# Patient Record
Sex: Female | Born: 1985 | Race: White | Hispanic: No | Marital: Married | Smoking: Current every day smoker
Health system: Southern US, Community
[De-identification: ages and names within clinical notes are randomized; demographics above are authoritative.]

## PROBLEM LIST (undated history)

## (undated) DIAGNOSIS — J45909 Unspecified asthma, uncomplicated: Secondary | ICD-10-CM

## (undated) DIAGNOSIS — F419 Anxiety disorder, unspecified: Secondary | ICD-10-CM

## (undated) HISTORY — PX: LEEP: SHX91

---
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
## 2007-04-15 ENCOUNTER — Emergency Department: Payer: Self-pay

## 2009-03-22 ENCOUNTER — Observation Stay: Payer: Self-pay

## 2009-05-14 ENCOUNTER — Observation Stay: Payer: Self-pay

## 2009-05-15 ENCOUNTER — Inpatient Hospital Stay: Payer: Self-pay | Admitting: Obstetrics & Gynecology

## 2010-01-14 ENCOUNTER — Ambulatory Visit: Payer: Self-pay | Admitting: Family Medicine

## 2012-06-01 ENCOUNTER — Emergency Department: Payer: Self-pay | Admitting: Emergency Medicine

## 2013-06-06 ENCOUNTER — Emergency Department: Payer: Self-pay | Admitting: Emergency Medicine

## 2014-02-02 ENCOUNTER — Emergency Department: Payer: Self-pay | Admitting: Emergency Medicine

## 2014-02-02 ENCOUNTER — Ambulatory Visit: Payer: Self-pay | Admitting: Family Medicine

## 2014-02-02 LAB — BASIC METABOLIC PANEL
Anion Gap: 8 (ref 7–16)
BUN: 12 mg/dL (ref 7–18)
CHLORIDE: 108 mmol/L — AB (ref 98–107)
Calcium, Total: 8.1 mg/dL — ABNORMAL LOW (ref 8.5–10.1)
Co2: 24 mmol/L (ref 21–32)
Creatinine: 0.68 mg/dL (ref 0.60–1.30)
EGFR (African American): >60
EGFR (Non-African Amer.): >60
GLUCOSE: 89 mg/dL (ref 65–99)
Osmolality: 279 (ref 275–301)
POTASSIUM: 3.3 mmol/L — AB (ref 3.5–5.1)
SODIUM: 140 mmol/L (ref 136–145)

## 2014-02-02 LAB — CBC
HCT: 39.9 % (ref 35.0–47.0)
HGB: 13 g/dL (ref 12.0–16.0)
MCH: 30.4 pg (ref 26.0–34.0)
MCHC: 32.6 g/dL (ref 32.0–36.0)
MCV: 93 fL (ref 80–100)
PLATELETS: 201 10*3/uL (ref 150–440)
RBC: 4.27 10*6/uL (ref 3.80–5.20)
RDW: 13.5 % (ref 11.5–14.5)
WBC: 10.7 10*3/uL (ref 3.6–11.0)

## 2014-02-02 LAB — LIPASE, BLOOD: Lipase: 120 U/L (ref 73–393)

## 2014-04-19 ENCOUNTER — Emergency Department: Payer: Self-pay | Admitting: Emergency Medicine

## 2014-05-21 ENCOUNTER — Ambulatory Visit: Payer: Self-pay | Admitting: Physician Assistant

## 2014-05-21 LAB — RAPID STREP-A WITH REFLX: Micro Text Report: NEGATIVE

## 2014-05-23 LAB — BETA STREP CULTURE(ARMC)

## 2014-07-04 ENCOUNTER — Emergency Department: Payer: Self-pay | Admitting: Emergency Medicine

## 2015-03-17 ENCOUNTER — Emergency Department
Admission: EM | Admit: 2015-03-17 | Discharge: 2015-03-17 | Disposition: A | Discharge status: Home / Self Care | Payer: Self-pay | Attending: Emergency Medicine | Admitting: Emergency Medicine

## 2015-03-17 DIAGNOSIS — J069 Acute upper respiratory infection, unspecified: Secondary | ICD-10-CM | POA: Insufficient documentation

## 2015-03-17 LAB — POCT RAPID STREP A: Streptococcus, Group A Screen (Direct): NEGATIVE

## 2015-03-17 MED ORDER — BENZONATATE 100 MG PO CAPS: 100.0000 mg | ORAL_CAPSULE | Freq: Four times a day (QID) | ORAL | Status: AC | PRN

## 2015-03-17 NOTE — ED Notes (Signed)
Pt c/o sore throat with cough congestion since Tuesday..  Denies fever..Marland Kitchen

## 2015-03-17 NOTE — ED Notes (Signed)
Pt reports she has been coughing up a lot and gagged and vomited sputum.

## 2015-03-17 NOTE — ED Provider Notes (Signed)
[facility] Emergency Department Provider Note  ____________________________________________  Time seen: On arrival  I have reviewed the triage vital signs and the nursing notes.   HISTORY  Chief Complaint Cough    HPI Shelia Butler is a 29 y.o. female who presents with complaints of cough, congestion and sore throat for approximately 6 days. She presents with her daughter who is also a patient with similar symptoms. She denies shortness of breath. She does report productive cough with yellow phlegm. No recent travel. She complains of mild sore throat and has tried ibuprofen and Tylenol with little relief.    No past medical history on file.  There are no active problems to display for this patient.   No past surgical history on file.  Current Outpatient Rx  Name  Route  Sig  Dispense  Refill  . benzonatate (TESSALON PERLES) 100 MG capsule   Oral   Take 1 capsule (100 mg total) by mouth every 6 (six) hours as needed for cough.   30 capsule   0     Allergies Review of patient's allergies indicates no known allergies.  No family history on file.  Social History Social History  Substance Use Topics  . Smoking status: Not on file  . Smokeless tobacco: Not on file  . Alcohol Use: Not on file    Review of Systems  Constitutional: Negative for fever. Eyes: Negative for visual changes. ENT: Negative for sore throat   Genitourinary: Negative for dysuria. Musculoskeletal: Negative for back pain. Skin: Negative for rash. Neurological: Negative for headaches or focal weakness   ____________________________________________   PHYSICAL EXAM:  VITAL SIGNS: ED Triage Vitals  Enc Vitals Group     BP 03/17/15 0854 128/51 mmHg     Pulse Rate 03/17/15 0854 80     Resp 03/17/15 0854 18     Temp 03/17/15 0854 98.4 F (36.9 C)     Temp Source 03/17/15 0854 Oral     SpO2 03/17/15 0854 99 %     Weight 03/17/15 0854 185 lb (83.915 kg)   Height 03/17/15 0854 5\' 5"  (1.651 m)     Head Cir --      Peak Flow --      Pain Score 03/17/15 0854 10     Pain Loc --      Pain Edu? --      Excl. in GC? --      Constitutional: Alert and oriented. Well appearing and in no distress. Eyes: Conjunctivae are normal.  ENT   Head: Normocephalic and atraumatic.   Mouth/Throat: Mucous membranes are moist. Pharynx is normal, no erythema or swelling or discharge Cardiovascular: Normal rate, regular rhythm.  Respiratory: Normal respiratory effort without tachypnea nor retractions. No wheezing or rales Musculoskeletal: Nontender with normal range of motion in all extremities. Neurologic:  Normal speech and language. No gross focal neurologic deficits are appreciated. Skin:  Skin is warm, dry and intact. No rash noted. Psychiatric: Mood and affect are normal. Patient exhibits appropriate insight and judgment.  ____________________________________________    LABS (pertinent positives/negatives)  Labs Reviewed  POCT RAPID STREP A    ____________________________________________     ____________________________________________    RADIOLOGY I have personally reviewed any xrays that were ordered on this patient: None  ____________________________________________   PROCEDURES  Procedure(s) performed: none   ____________________________________________   INITIAL IMPRESSION / ASSESSMENT AND PLAN / ED COURSE  Pertinent labs & imaging results that were available during my care of  the patient were reviewed by me and considered in my medical decision making (see chart for details).  Patient overall well-appearing. Vital signs are unremarkable. She has no shortness of breath. No wheezing on exam. Her constellation of symptoms are consistent with a viral upper respiratory infection. Along with her daughter I recommended supportive care and follow-up with PCP if any worsening of symptoms. I have advised that viral infections  typically last between 7 and 10 days  ____________________________________________   FINAL CLINICAL IMPRESSION(S) / ED DIAGNOSES  Final diagnoses:  Upper respiratory infection     Jene Every, MD 03/17/15 1043

## 2015-03-17 NOTE — Discharge Instructions (Signed)
Upper Respiratory Infection, Adult Most upper respiratory infections (URIs) are a viral infection of the air passages leading to the lungs. A URI affects the nose, throat, and upper air passages. The most common type of URI is nasopharyngitis and is typically referred to as "the common cold." URIs run their course and usually go away on their own. Most of the time, a URI does not require medical attention, but sometimes a bacterial infection in the upper airways can follow a viral infection. This is called a secondary infection. Sinus and middle ear infections are common types of secondary upper respiratory infections. Bacterial pneumonia can also complicate a URI. A URI can worsen asthma and chronic obstructive pulmonary disease (COPD). Sometimes, these complications can require emergency medical care and may be life threatening.  CAUSES Almost all URIs are caused by viruses. A virus is a type of germ and can spread from one person to another.  RISKS FACTORS You may be at risk for a URI if:   You smoke.   You have chronic heart or lung disease.  You have a weakened defense (immune) system.   You are very young or very old.   You have nasal allergies or asthma.  You work in crowded or poorly ventilated areas.  You work in health care facilities or schools. SIGNS AND SYMPTOMS  Symptoms typically develop 2-3 days after you come in contact with a cold virus. Most viral URIs last 7-10 days. However, viral URIs from the influenza virus (flu virus) can last 14-18 days and are typically more severe. Symptoms may include:   Runny or stuffy (congested) nose.   Sneezing.   Cough.   Sore throat.   Headache.   Fatigue.   Fever.   Loss of appetite.   Pain in your forehead, behind your eyes, and over your cheekbones (sinus pain).  Muscle aches.  DIAGNOSIS  Your health care provider may diagnose a URI by:  Physical exam.  Tests to check that your symptoms are not due to  another condition such as:  Strep throat.  Sinusitis.  Pneumonia.  Asthma. TREATMENT  A URI goes away on its own with time. It cannot be cured with medicines, but medicines may be prescribed or recommended to relieve symptoms. Medicines may help:  Reduce your fever.  Reduce your cough.  Relieve nasal congestion. HOME CARE INSTRUCTIONS   Take medicines only as directed by your health care provider.   Gargle warm saltwater or take cough drops to comfort your throat as directed by your health care provider.  Use a warm mist humidifier or inhale steam from a shower to increase air moisture. This may make it easier to breathe.  Drink enough fluid to keep your urine clear or pale yellow.   Eat soups and other clear broths and maintain good nutrition.   Rest as needed.   Return to work when your temperature has returned to normal or as your health care provider advises. You may need to stay home longer to avoid infecting others. You can also use a face mask and careful hand washing to prevent spread of the virus.  Increase the usage of your inhaler if you have asthma.   Do not use any tobacco products, including cigarettes, chewing tobacco, or electronic cigarettes. If you need help quitting, ask your health care provider. PREVENTION  The best way to protect yourself from getting a cold is to practice good hygiene.   Avoid oral or hand contact with people with cold   symptoms.   Wash your hands often if contact occurs.  There is no clear evidence that vitamin C, vitamin E, echinacea, or exercise reduces the chance of developing a cold. However, it is always recommended to get plenty of rest, exercise, and practice good nutrition.  SEEK MEDICAL CARE IF:   You are getting worse rather than better.   Your symptoms are not controlled by medicine.   You have chills.  You have worsening shortness of breath.  You have brown or red mucus.  You have yellow or brown nasal  discharge.  You have pain in your face, especially when you bend forward.  You have a fever.  You have swollen neck glands.  You have pain while swallowing.  You have white areas in the back of your throat. SEEK IMMEDIATE MEDICAL CARE IF:   You have severe or persistent:  Headache.  Ear pain.  Sinus pain.  Chest pain.  You have chronic lung disease and any of the following:  Wheezing.  Prolonged cough.  Coughing up blood.  A change in your usual mucus.  You have a stiff neck.  You have changes in your:  Vision.  Hearing.  Thinking.  Mood. MAKE SURE YOU:   Understand these instructions.  Will watch your condition.  Will get help right away if you are not doing well or get worse.   This information is not intended to replace advice given to you by your health care provider. Make sure you discuss any questions you have with your health care provider.   Document Released: 11/11/2000 Document Revised: 10/02/2014 Document Reviewed: 08/23/2013 Elsevier Interactive Patient Education 2016 [employer]  

## 2015-03-19 LAB — CULTURE, GROUP A STREP (THRC)

## 2015-05-21 ENCOUNTER — Encounter: Payer: Self-pay | Admitting: Emergency Medicine

## 2015-05-21 ENCOUNTER — Other Ambulatory Visit: Payer: Self-pay

## 2015-05-21 ENCOUNTER — Emergency Department
Admission: EM | Admit: 2015-05-21 | Discharge: 2015-05-21 | Disposition: A | Discharge status: Home / Self Care | Payer: Self-pay | Attending: Emergency Medicine | Admitting: Emergency Medicine

## 2015-05-21 ENCOUNTER — Emergency Department: Payer: Self-pay

## 2015-05-21 DIAGNOSIS — R0789 Other chest pain: Secondary | ICD-10-CM | POA: Insufficient documentation

## 2015-05-21 DIAGNOSIS — F17200 Nicotine dependence, unspecified, uncomplicated: Secondary | ICD-10-CM | POA: Insufficient documentation

## 2015-05-21 HISTORY — DX: Unspecified asthma, uncomplicated: J45.909

## 2015-05-21 MED ORDER — NAPROXEN 500 MG PO TABS: 500.0000 mg | ORAL_TABLET | Freq: Two times a day (BID) | ORAL | Status: DC

## 2015-05-21 NOTE — Discharge Instructions (Signed)

## 2015-05-21 NOTE — ED Provider Notes (Signed)
CSN: [id number]     Arrival date & time 05/21/15  1038 History   First MD Initiated Contact with Patient 05/21/15 1120     Chief Complaint  Patient presents with  . Breathing Problem    HPI Comments: 29 year old female presents today complaining of pain under left breast with deep breath over the past 3 days. She has not had a cough or congestion. She does have a history of asthma but reports no dyspnea. Has not had any recent travel, no leg pain or swelling. Has not taken anything for the pain. Does not recall an injury to her chest. Smokes 1/2-1ppd of cigarettes  The history is provided by the patient.    Past Medical History  Diagnosis Date  . Asthma    No past surgical history on file. No family history on file. Social History  Substance Use Topics  . Smoking status: Current Every Day Smoker  . Smokeless tobacco: None  . Alcohol Use: Yes   OB History    No data available     Review of Systems  Constitutional: Negative.  Negative for fever and chills.  Respiratory: Negative for cough, shortness of breath and wheezing.   Cardiovascular: Positive for chest pain. Negative for palpitations and leg swelling.  All other systems reviewed and are negative.     Allergies  Review of patient's allergies indicates no known allergies.  Home Medications   Prior to Admission medications   Medication Sig Start Date End Date Taking? Authorizing Provider  benzonatate (TESSALON PERLES) 100 MG capsule Take 1 capsule (100 mg total) by mouth every 6 (six) hours as needed for cough. 03/17/15 03/16/16  Jene Every, MD  naproxen (NAPROSYN) 500 MG tablet Take 1 tablet (500 mg total) by mouth 2 (two) times daily with a meal. 05/21/15   Christella Scheuermann, PA-C   BP 124/61 mmHg  Pulse 76  Temp(Src) 98.1 F (36.7 C)  Resp 14  Ht  (1.676 m)  Wt 81.647 kg  BMI 29.07 kg/m2  SpO2 100%  LMP 05/05/2015 Physical Exam  Constitutional: She is oriented to person, place, and time. Vital  signs are normal. She appears well-developed and well-nourished. She is active.  Non-toxic appearance. She does not have a sickly appearance. She does not appear ill.  HENT:  Head: Normocephalic and atraumatic.  Right Ear: Tympanic membrane and external ear normal.  Left Ear: Tympanic membrane and external ear normal.  Nose: Nose normal.  Mouth/Throat: Oropharynx is clear and moist.  Eyes: Conjunctivae and EOM are normal. Pupils are equal, round, and reactive to light.  Neck: Normal range of motion. Neck supple.  Cardiovascular: Normal rate, regular rhythm, normal heart sounds and intact distal pulses.  Exam reveals no gallop and no friction rub.   No murmur heard. Pulmonary/Chest: Effort normal and breath sounds normal. No respiratory distress. She has no wheezes. She has no rales. She exhibits tenderness.    Musculoskeletal: Normal range of motion.  No pedal edema. No calf swelling or tenderness  Lymphadenopathy:    She has no cervical adenopathy.  Neurological: She is alert and oriented to person, place, and time.  Skin: Skin is warm and dry.  Psychiatric: She has a normal mood and affect. Her behavior is normal. Judgment and thought content normal.  Nursing note and vitals reviewed.   ED Course  Procedures (including critical care time) Labs Review Labs Reviewed - No data to display  Imaging Review Dg Chest 2 View  05/21/2015  CLINICAL DATA:  Left-sided chest pain for 3 days EXAM: CHEST  2 VIEW COMPARISON:  June 02, 2012 FINDINGS: Lungs are clear. Heart size and pulmonary vascularity are normal. No adenopathy. No pneumothorax. There is thoracolumbar levoscoliosis. IMPRESSION: No edema or consolidation. Electronically Signed   By: Bretta BangWilliam  Woodruff III M.D.   On: 05/21/2015 12:03   I have personally reviewed and evaluated these images and lab results as part of my medical decision-making.   EKG Interpretation None     EKG: normal EKG, normal sinus rhythm. Reviewed by Dr.  Daryel NovemberJonathan Williams    MDM  i reviewed EKG and CXR. There is no evidence of any acute disease. Likely costochondritis RX for naproxen BId with food. Pt meets PERC criteria Return to ER for increased pain,  Development of shortness of breath or leg swelling. Encouraged smoking cessation.  Final diagnoses:  Chest wall pain        Christella Scheuermannmma Lorae Roig V, PA-C 05/21/15 1220  Emily FilbertJonathan E Williams, MD 05/21/15 [id number]

## 2015-05-21 NOTE — ED Notes (Addendum)
Says pain under left breast that feels like a needle with deep breaths for 3 days.  Slight runny nose x 1 day. No fever.  Thinks she may have pulled muscle

## 2018-08-01 ENCOUNTER — Ambulatory Visit
Admission: EM | Admit: 2018-08-01 | Discharge: 2018-08-01 | Disposition: A | Discharge status: Home / Self Care | Payer: Self-pay | Attending: Internal Medicine | Admitting: Internal Medicine

## 2018-08-01 ENCOUNTER — Ambulatory Visit (INDEPENDENT_AMBULATORY_CARE_PROVIDER_SITE_OTHER): Payer: Self-pay

## 2018-08-01 ENCOUNTER — Other Ambulatory Visit: Payer: Self-pay

## 2018-08-01 ENCOUNTER — Encounter: Payer: Self-pay | Admitting: Emergency Medicine

## 2018-08-01 DIAGNOSIS — B9789 Other viral agents as the cause of diseases classified elsewhere: Secondary | ICD-10-CM

## 2018-08-01 DIAGNOSIS — M791 Myalgia, unspecified site: Secondary | ICD-10-CM

## 2018-08-01 DIAGNOSIS — J029 Acute pharyngitis, unspecified: Secondary | ICD-10-CM

## 2018-08-01 DIAGNOSIS — J069 Acute upper respiratory infection, unspecified: Secondary | ICD-10-CM

## 2018-08-01 DIAGNOSIS — J4521 Mild intermittent asthma with (acute) exacerbation: Secondary | ICD-10-CM

## 2018-08-01 DIAGNOSIS — Z716 Tobacco abuse counseling: Secondary | ICD-10-CM

## 2018-08-01 DIAGNOSIS — R0602 Shortness of breath: Secondary | ICD-10-CM

## 2018-08-01 DIAGNOSIS — R0981 Nasal congestion: Secondary | ICD-10-CM

## 2018-08-01 DIAGNOSIS — F17210 Nicotine dependence, cigarettes, uncomplicated: Secondary | ICD-10-CM

## 2018-08-01 DIAGNOSIS — R062 Wheezing: Secondary | ICD-10-CM

## 2018-08-01 HISTORY — DX: Anxiety disorder, unspecified: F41.9

## 2018-08-01 LAB — RAPID STREP SCREEN (MED CTR MEBANE ONLY): Streptococcus, Group A Screen (Direct): NEGATIVE

## 2018-08-01 MED ORDER — ALBUTEROL SULFATE HFA 108 (90 BASE) MCG/ACT IN AERS: 1.0000 | INHALATION_SPRAY | Freq: Four times a day (QID) | RESPIRATORY_TRACT | 0 refills | Status: DC | PRN

## 2018-08-01 MED ORDER — BENZONATATE 100 MG PO CAPS: 100.0000 mg | ORAL_CAPSULE | Freq: Three times a day (TID) | ORAL | 0 refills | Status: DC

## 2018-08-01 NOTE — ED Provider Notes (Addendum)
[facility]    CSN: [id number] Arrival date & time: 08/01/18  0076     History   Chief Complaint Chief Complaint  Patient presents with  . Cough  . Shortness of Breath    HPI Shelia Butler is a 33 y.o. female with history of mild intermittent asthma comes to the urgent care with complaints of  cough productive of greenish sputum of 4 days duration.  Patient symptoms started 4 days ago with runny nose, sore throat and a cough.  Patient had headaches and generalized body aches.  She had fever which was subjective.  Cough productive of greenish sputum.  She has also developed right-sided chest pain which is intermittent, worse today.  It is of moderate severity and aggravated by cough.  No known relieving factors with over-the-counter medications.  Patient had some fullness in both ears.  Denies any vomiting.  HPI  Past Medical History:  Diagnosis Date  . Anxiety   . Asthma     There are no active problems to display for this patient.   Past Surgical History:  Procedure Laterality Date  . LEEP      OB History   No obstetric history on file.      Home Medications    Prior to Admission medications   Not on File    Family History Family History  Problem Relation Age of Onset  . Breast cancer Mother   . Heart disease Mother   . Heart attack Mother 2  . Diabetes Father   . Osteoarthritis Father   . Alcohol abuse Father     Social History Social History   Tobacco Use  . Smoking status: Current Every Day Smoker    Packs/day: 0.50    Years: 15.00    Pack years: 7.50    Types: Cigarettes  . Smokeless tobacco: Never Used  Substance Use Topics  . Alcohol use: Yes    Comment: occassional  . Drug use: Yes    Frequency: 10.0 times per week    Types: Marijuana    Comment: 1-2 times per day     Allergies   Patient has no known allergies.   Review of Systems Review of Systems  Constitutional: Positive for activity change, chills and fever.  Negative for appetite change and diaphoresis.  HENT: Positive for congestion, ear pain, rhinorrhea and sore throat. Negative for dental problem, drooling, ear discharge and postnasal drip.   Eyes: Negative for discharge and itching.  Respiratory: Positive for wheezing. Negative for chest tightness.   Gastrointestinal: Negative for abdominal distention and abdominal pain.  Genitourinary: Negative for dysuria and hematuria.  Musculoskeletal: Positive for arthralgias and myalgias. Negative for back pain and joint swelling.  Skin: Negative for rash and wound.  Neurological: Positive for headaches. Negative for dizziness and weakness.  Hematological: Negative for adenopathy.     Physical Exam Triage Vital Signs ED Triage Vitals  Enc Vitals Group     BP 08/01/18 0828 137/79     Pulse Rate 08/01/18 0828 98     Resp 08/01/18 0828 16     Temp 08/01/18 0828 98 F (36.7 C)     Temp Source 08/01/18 0828 Oral     SpO2 08/01/18 0828 100 %     Weight 08/01/18 0829 178 lb 6.4 oz (80.9 kg)     Height 08/01/18 0829 5\' 5"  (1.651 m)     Head Circumference --      Peak Flow --  Pain Score 08/01/18 0828 10     Pain Loc --      Pain Edu? --      Excl. in GC? --    No data found.  Updated Vital Signs BP 137/79 (BP Location: Left Arm)   Pulse 98   Temp 98 F (36.7 C) (Oral)   Resp 16   Ht 5\' 5"  (1.651 m)   Wt 80.9 kg   LMP 07/08/2018 (Approximate)   SpO2 100%   BMI 29.69 kg/m   Visual Acuity Right Eye Distance:   Left Eye Distance:   Bilateral Distance:    Right Eye Near:   Left Eye Near:    Bilateral Near:     Physical Exam Constitutional:      Appearance: She is well-developed. She is ill-appearing.  HENT:     Mouth/Throat:     Mouth: Mucous membranes are moist.     Pharynx: Oropharyngeal exudate present. No pharyngeal swelling.     Comments: White patches over the left tonsillar pillars. Neck:     Musculoskeletal: Normal range of motion.     Vascular: No JVD.    Cardiovascular:     Rate and Rhythm: Normal rate and regular rhythm.     Heart sounds: No murmur. No gallop.   Pulmonary:     Effort: Pulmonary effort is normal. No accessory muscle usage.     Breath sounds: Normal breath sounds. No decreased breath sounds.  Chest:     Chest wall: No mass, deformity, tenderness or edema. There is no dullness to percussion.  Abdominal:     General: Bowel sounds are normal.     Palpations: Abdomen is soft.  Lymphadenopathy:     Cervical: No cervical adenopathy.  Neurological:     Mental Status: She is alert.      UC Treatments / Results  Labs (all labs ordered are listed, but only abnormal results are displayed) Labs Reviewed  RAPID STREP SCREEN ([facility] ONLY)    EKG None  Radiology No results found.  Procedures Procedures (including critical care time)  Medications Ordered in UC Medications - No data to display  Initial Impression / Assessment and Plan / UC Course  I have reviewed the triage vital signs and the nursing notes.  Pertinent labs & imaging results that were available during my care of the patient were reviewed by me and considered in my medical decision making (see chart for details).     1.  Viral illness with cough: Tessalon Perles Tylenol/Motrin for fever/body aches  2.  Mild intermittent asthma with acute exacerbation: Albuterol inhaler to be used as needed No indication for steroids at this time since patient has good air entry and no wheezing  3.  Tobacco use: Tobacco cessation counseling given Patient is in the pre-contemplative stage of tobacco cessation. Final Clinical Impressions(s) / UC Diagnoses   Final diagnoses:  None   Discharge Instructions   None    ED Prescriptions    None     Controlled Substance Prescriptions [place] Controlled Substance Registry consulted? No   Merrilee Jansky, MD 08/01/18 3009    Merrilee Jansky, MD 08/01/18 1058

## 2018-08-01 NOTE — ED Triage Notes (Signed)
Patient in today c/o cough, sob and pain in her right breast area. Patient has felt feverish and had chills, but hasn't taken her temperature. Patient has tried OTC Dayquil, Nyquil without relief.

## 2018-08-02 ENCOUNTER — Telehealth: Payer: Self-pay | Admitting: Internal Medicine

## 2018-08-02 MED ORDER — AZITHROMYCIN 250 MG PO TABS: ORAL_TABLET | ORAL | 0 refills | Status: DC

## 2018-08-02 MED ORDER — AMOXICILLIN-POT CLAVULANATE 875-125 MG PO TABS: 1.0000 | ORAL_TABLET | Freq: Two times a day (BID) | ORAL | 0 refills | Status: AC

## 2018-08-02 NOTE — Telephone Encounter (Signed)
X-ray was read as showing early infiltrate in the right lung field.  I called patient to check on her and she feels better but she continues to have chest pain with cough.  I told her that I will call in some antibiotics to the pharmacy should she can pick it up.

## 2018-08-03 LAB — CULTURE, GROUP A STREP (THRC)

## 2018-08-04 ENCOUNTER — Other Ambulatory Visit: Payer: Self-pay

## 2018-08-04 ENCOUNTER — Ambulatory Visit
Admission: EM | Admit: 2018-08-04 | Discharge: 2018-08-04 | Disposition: A | Discharge status: Home / Self Care | Payer: Self-pay | Attending: Family Medicine | Admitting: Family Medicine

## 2018-08-04 ENCOUNTER — Encounter: Payer: Self-pay | Admitting: Emergency Medicine

## 2018-08-04 DIAGNOSIS — J181 Lobar pneumonia, unspecified organism: Secondary | ICD-10-CM

## 2018-08-04 DIAGNOSIS — F17210 Nicotine dependence, cigarettes, uncomplicated: Secondary | ICD-10-CM

## 2018-08-04 DIAGNOSIS — J189 Pneumonia, unspecified organism: Secondary | ICD-10-CM

## 2018-08-04 MED ORDER — HYDROCOD POLST-CPM POLST ER 10-8 MG/5ML PO SUER: 5.0000 mL | Freq: Two times a day (BID) | ORAL | 0 refills | Status: DC

## 2018-08-04 MED ORDER — PREDNISONE 20 MG PO TABS: ORAL_TABLET | ORAL | 0 refills | Status: DC

## 2018-08-04 NOTE — ED Provider Notes (Signed)
[facility]    CSN: [id number] Arrival date & time: 08/04/18  1140     History   Chief Complaint Chief Complaint  Patient presents with  . Cough    APPT    HPI Shelia Butler is a 33 y.o. female.   HPI  Female presents today with a continued cough.  She was seen on 08/01/2018.  At that time it was initially thought that she had an upper respiratory infection but x-rays were read as a possible infiltrate of the right middle lobe.  She was treated appropriately with antibiotics.  However she states that she feels like her cough is worsening.  She has had no wheezing.  She been using the albuterol inhaler 3 times a day.  Tessalon Perles have not seemed to help her with her cough.  Coughing seems incessant causes her to have shortness of breath which in turn causes her to be very anxious which worsens the condition.  Today she appears very anxious.  She cries at intervals throughout the examination and interview.        Past Medical History:  Diagnosis Date  . Anxiety   . Asthma     There are no active problems to display for this patient.   Past Surgical History:  Procedure Laterality Date  . LEEP      OB History   No obstetric history on file.      Home Medications    Prior to Admission medications   Medication Sig Start Date End Date Taking? Authorizing Provider  albuterol (PROVENTIL HFA;VENTOLIN HFA) 108 (90 Base) MCG/ACT inhaler Inhale 1-2 puffs into the lungs every 6 (six) hours as needed for wheezing or shortness of breath. 08/01/18  Yes Lamptey, Britta Mccreedy, MD  amoxicillin-clavulanate (AUGMENTIN) 875-125 MG tablet Take 1 tablet by mouth every 12 (twelve) hours for 5 days. 08/02/18 08/07/18 Yes Lamptey, Britta Mccreedy, MD  benzonatate (TESSALON) 100 MG capsule Take 1 capsule (100 mg total) by mouth every 8 (eight) hours. 08/01/18  Yes Lamptey, Britta Mccreedy, MD  azithromycin (ZITHROMAX Z-PAK) 250 MG tablet Please take 2 tablets orally on day 1 and then 1 tablet orally  daily from day 2 through day 5. 08/02/18   Lamptey, Britta Mccreedy, MD  chlorpheniramine-HYDROcodone (TUSSIONEX PENNKINETIC ER) 10-8 MG/5ML SUER Take 5 mLs by mouth 2 (two) times daily. 08/04/18   Lutricia Feil, PA-C  predniSONE (DELTASONE) 20 MG tablet Take 2 tablets (40 mg) daily by mouth 08/04/18   Lutricia Feil, PA-C    Family History Family History  Problem Relation Age of Onset  . Breast cancer Mother   . Heart disease Mother   . Heart attack Mother 13  . Diabetes Father   . Osteoarthritis Father   . Alcohol abuse Father     Social History Social History   Tobacco Use  . Smoking status: Current Every Day Smoker    Packs/day: 0.50    Years: 15.00    Pack years: 7.50    Types: Cigarettes  . Smokeless tobacco: Never Used  Substance Use Topics  . Alcohol use: Yes    Comment: occassional  . Drug use: Yes    Frequency: 10.0 times per week    Types: Marijuana    Comment: 1-2 times per day     Allergies   Patient has no known allergies.   Review of Systems Review of Systems  Constitutional: Positive for activity change and fatigue. Negative for appetite change, chills and fever.  HENT: Positive for congestion.   Respiratory: Positive for cough and shortness of breath. Negative for wheezing and stridor.   All other systems reviewed and are negative.    Physical Exam Triage Vital Signs ED Triage Vitals  Enc Vitals Group     BP 08/04/18 1152 136/72     Pulse Rate 08/04/18 1152 94     Resp 08/04/18 1152 18     Temp 08/04/18 1152 98.2 F (36.8 C)     Temp Source 08/04/18 1152 Oral     SpO2 08/04/18 1152 100 %     Weight 08/04/18 1152 175 lb (79.4 kg)     Height 08/04/18 1152 5\' 5"  (1.651 m)     Head Circumference --      Peak Flow --      Pain Score 08/04/18 1151 10     Pain Loc --      Pain Edu? --      Excl. in GC? --    No data found.  Updated Vital Signs BP 136/72 (BP Location: Left Arm)   Pulse 94   Temp 98.2 F (36.8 C) (Oral)   Resp 18   Ht 5'  5" (1.651 m)   Wt 175 lb (79.4 kg)   LMP 07/08/2018 (Approximate)   SpO2 100%   BMI 29.12 kg/m   Visual Acuity Right Eye Distance:   Left Eye Distance:   Bilateral Distance:    Right Eye Near:   Left Eye Near:    Bilateral Near:     Physical Exam Vitals signs and nursing note reviewed.  Constitutional:      General: She is not in acute distress.    Appearance: Normal appearance. She is not ill-appearing, toxic-appearing or diaphoretic.  HENT:     Head: Normocephalic.     Right Ear: Tympanic membrane, ear canal and external ear normal. There is no impacted cerumen.     Left Ear: Tympanic membrane, ear canal and external ear normal. There is no impacted cerumen.     Nose: Nose normal. No congestion or rhinorrhea.     Mouth/Throat:     Mouth: Mucous membranes are moist.     Pharynx: Oropharynx is clear.  Eyes:     General:        Right eye: No discharge.        Left eye: No discharge.     Conjunctiva/sclera: Conjunctivae normal.  Neck:     Musculoskeletal: Normal range of motion and neck supple.  Pulmonary:     Effort: Pulmonary effort is normal. No respiratory distress.     Breath sounds: Normal breath sounds. No stridor. No wheezing, rhonchi or rales.  Musculoskeletal: Normal range of motion.  Lymphadenopathy:     Cervical: No cervical adenopathy.  Skin:    General: Skin is warm and dry.  Neurological:     General: No focal deficit present.     Mental Status: She is alert and oriented to person, place, and time.  Psychiatric:        Mood and Affect: Mood normal.        Behavior: Behavior normal.        Thought Content: Thought content normal.        Judgment: Judgment normal.      UC Treatments / Results  Labs (all labs ordered are listed, but only abnormal results are displayed) Labs Reviewed - No data to display  EKG None  Radiology No results found.  Procedures Procedures (including  critical care time)  Medications Ordered in UC Medications -  No data to display  Initial Impression / Assessment and Plan / UC Course  I have reviewed the triage vital signs and the nursing notes.  Pertinent labs & imaging results that were available during my care of the patient were reviewed by me and considered in my medical decision making (see chart for details).   -year-old female presents today after being seen initially on 3/2 /20 found  to have right middle lobe infiltrate consistent with pneumonia.  It was treated appropriately and she is continuing with the medications.  She feels that her cough has worsened over that period of time.  Tessalon  Perles have not been beneficial for her.  She has no wheezing but she is having incessant coughing.  She coughs throughout the entire examination and interview.  He is no respiratory distress.  I have advised her that she may use the albuterol inhaler every 4 hours as necessary for coughing.  I will prescribe short course of prednisone.  I will also provide her with Tussionex cough syrup help decrease her coughing urge.  You worsen she should go to the emergency room.   Final Clinical Impressions(s) / UC Diagnoses   Final diagnoses:  Community acquired pneumonia of right middle lobe of lung (HCC)     Discharge Instructions     Use the albuterol inhaler every 4 hours as necessary for cough.  Be sure to complete all of your antibiotics.  If you worsen or not improving efficiently go to the emergency room.  Recommend following up with your primary care physician in 4 to 6 weeks    ED Prescriptions    Medication Sig Dispense Auth. Provider   chlorpheniramine-HYDROcodone (TUSSIONEX PENNKINETIC ER) 10-8 MG/5ML SUER Take 5 mLs by mouth 2 (two) times daily. 115 mL Ovid Curd P, PA-C   predniSONE (DELTASONE) 20 MG tablet Take 2 tablets (40 mg) daily by mouth 8 tablet Lutricia Feil, PA-C     Controlled Substance Prescriptions  Controlled Substance Registry consulted? Not Applicable   Lutricia Feil, PA-C 08/04/18 1306

## 2018-08-04 NOTE — ED Triage Notes (Signed)
Patient in today with continued cough. Patient seen 08/01/18 for same.

## 2018-08-04 NOTE — Discharge Instructions (Signed)
Use the albuterol inhaler every 4 hours as necessary for cough.  Be sure to complete all of your antibiotics.  If you worsen or not improving efficiently go to the emergency room.  Recommend following up with your primary care physician in 4 to 6 weeks

## 2019-03-19 ENCOUNTER — Other Ambulatory Visit: Payer: Self-pay

## 2019-03-19 ENCOUNTER — Emergency Department
Admission: EM | Admit: 2019-03-19 | Discharge: 2019-03-20 | Disposition: A | Discharge status: Home / Self Care | Payer: Self-pay | Attending: Student in an Organized Health Care Education/Training Program | Admitting: Student in an Organized Health Care Education/Training Program

## 2019-03-19 ENCOUNTER — Emergency Department: Payer: Self-pay

## 2019-03-19 DIAGNOSIS — J45909 Unspecified asthma, uncomplicated: Secondary | ICD-10-CM | POA: Insufficient documentation

## 2019-03-19 DIAGNOSIS — F17210 Nicotine dependence, cigarettes, uncomplicated: Secondary | ICD-10-CM | POA: Insufficient documentation

## 2019-03-19 DIAGNOSIS — Z79899 Other long term (current) drug therapy: Secondary | ICD-10-CM | POA: Insufficient documentation

## 2019-03-19 DIAGNOSIS — R109 Unspecified abdominal pain: Secondary | ICD-10-CM

## 2019-03-19 DIAGNOSIS — G9059 Complex regional pain syndrome I of other specified site: Secondary | ICD-10-CM | POA: Insufficient documentation

## 2019-03-19 LAB — CBC
HCT: 44 % (ref 36.0–46.0)
Hemoglobin: 14.9 g/dL (ref 12.0–15.0)
MCH: 30.8 pg (ref 26.0–34.0)
MCHC: 33.9 g/dL (ref 30.0–36.0)
MCV: 91.1 fL (ref 80.0–100.0)
Platelets: 249 10*3/uL (ref 150–400)
RBC: 4.83 MIL/uL (ref 3.87–5.11)
RDW: 13.7 % (ref 11.5–15.5)
WBC: 10 10*3/uL (ref 4.0–10.5)
nRBC: 0 % (ref 0.0–0.2)

## 2019-03-19 LAB — LIPASE, BLOOD: Lipase: 41 U/L (ref 11–51)

## 2019-03-19 LAB — COMPREHENSIVE METABOLIC PANEL
ALT: 13 U/L (ref 0–44)
AST: 16 U/L (ref 15–41)
Albumin: 3.8 g/dL (ref 3.5–5.0)
Alkaline Phosphatase: 36 U/L — ABNORMAL LOW (ref 38–126)
Anion gap: 7 (ref 5–15)
BUN: 8 mg/dL (ref 6–20)
CO2: 23 mmol/L (ref 22–32)
Calcium: 8.4 mg/dL — ABNORMAL LOW (ref 8.9–10.3)
Chloride: 111 mmol/L (ref 98–111)
Creatinine, Ser: 0.55 mg/dL (ref 0.44–1.00)
GFR calc Af Amer: >60 mL/min (ref >60)
GFR calc non Af Amer: >60 mL/min (ref >60)
Glucose, Bld: 110 mg/dL — ABNORMAL HIGH (ref 70–99)
Potassium: 3.6 mmol/L (ref 3.5–5.1)
Sodium: 141 mmol/L (ref 135–145)
Total Bilirubin: 0.5 mg/dL (ref 0.3–1.2)
Total Protein: 6.5 g/dL (ref 6.5–8.1)

## 2019-03-19 LAB — URINALYSIS, COMPLETE (UACMP) WITH MICROSCOPIC
Bacteria, UA: NONE SEEN
Bilirubin Urine: NEGATIVE
Glucose, UA: NEGATIVE mg/dL
Ketones, ur: NEGATIVE mg/dL
Leukocytes,Ua: NEGATIVE
Nitrite: NEGATIVE
Protein, ur: NEGATIVE mg/dL
Specific Gravity, Urine: 1.005 (ref 1.005–1.030)
pH: 6 (ref 5.0–8.0)

## 2019-03-19 LAB — POCT PREGNANCY, URINE: Preg Test, Ur: NEGATIVE

## 2019-03-19 MED ORDER — ONDANSETRON HCL 4 MG/2ML IJ SOLN
4.0000 mg | Freq: Once | INTRAMUSCULAR | Status: AC
  Administered 2019-03-19: 4 mg via INTRAVENOUS
  Filled 2019-03-19: qty 2

## 2019-03-19 MED ORDER — MORPHINE SULFATE (PF) 4 MG/ML IV SOLN
4.0000 mg | INTRAVENOUS | Status: DC | PRN
  Administered 2019-03-19: 4 mg via INTRAVENOUS
  Filled 2019-03-19: qty 1

## 2019-03-19 NOTE — ED Triage Notes (Signed)
Patient reports left lower abdominal pain all day that has gotten progressively worse.  Patient reports had a period at the beginning of the month but started bleeding again this morning.

## 2019-03-19 NOTE — ED Notes (Signed)
Patient assisted to bathroom 

## 2019-03-19 NOTE — ED Provider Notes (Addendum)
[facility] Emergency Department Provider Note    First MD Initiated Contact with Patient 03/19/19 2156     (approximate)  I have reviewed the triage vital signs and the nursing notes.   HISTORY  Chief Complaint Abdominal Pain    HPI Shelia Butler is a 33 y.o. female visit ER for evaluation of left lower quadrant abdominal pain as well as some pelvic discomfort.  He started her menstrual cycle.  Has not had any dysuria.  No fevers.  Is never had pain like this before.  States it was sudden in onset.  Does have family history of kidney stones.  Denies any vaginal discharge.  No dysuria.  States the pain is moderate to severe.    Past Medical History:  Diagnosis Date  . Anxiety   . Asthma    Family History  Problem Relation Age of Onset  . Breast cancer Mother   . Heart disease Mother   . Heart attack Mother [id number]  . Diabetes Father   . Osteoarthritis Father   . Alcohol abuse Father    Past Surgical History:  Procedure Laterality Date  . LEEP     There are no active problems to display for this patient.     Prior to Admission medications   Medication Sig Start Date End Date Taking? Authorizing Provider  albuterol (PROVENTIL HFA;VENTOLIN HFA) 108 (90 Base) MCG/ACT inhaler Inhale 1-2 puffs into the lungs every 6 (six) hours as needed for wheezing or shortness of breath. 08/01/18   Lamptey, Britta MccreedyPhilip O, MD  azithromycin (ZITHROMAX Z-PAK) 250 MG tablet Please take 2 tablets orally on day 1 and then 1 tablet orally daily from day 2 through day 5. 08/02/18   Lamptey, Britta MccreedyPhilip O, MD  benzonatate (TESSALON) 100 MG capsule Take 1 capsule (100 mg total) by mouth every 8 (eight) hours. 08/01/18   LampteyBritta Mccreedy, Philip O, MD  chlorpheniramine-HYDROcodone (TUSSIONEX PENNKINETIC ER) 10-8 MG/5ML SUER Take 5 mLs by mouth 2 (two) times daily. 08/04/18   Lutricia Feiloemer, William P, PA-C  predniSONE (DELTASONE) 20 MG tablet Take 2 tablets (40 mg) daily by mouth 08/04/18   Lutricia Feiloemer, William P,  PA-C    Allergies Patient has no known allergies.    Social History Social History   Tobacco Use  . Smoking status: Current Every Day Smoker    Packs/day: 0.50    Years: 15.00    Pack years: 7.50    Types: Cigarettes  . Smokeless tobacco: Never Used  Substance Use Topics  . Alcohol use: Yes    Comment: occassional  . Drug use: Yes    Frequency: 10.0 times per week    Types: Marijuana    Comment: 1-2 times per day    Review of Systems Patient denies headaches, rhinorrhea, blurry vision, numbness, shortness of breath, chest pain, edema, cough, abdominal pain, nausea, vomiting, diarrhea, dysuria, fevers, rashes or hallucinations unless otherwise stated above in HPI. ____________________________________________   PHYSICAL EXAM:  VITAL SIGNS: Vitals:   03/19/19 2218  BP: 112/73  Pulse: 75  SpO2: 99%    Constitutional: Alert and oriented.  Eyes: Conjunctivae are normal.  Head: Atraumatic. Nose: No congestion/rhinnorhea. Mouth/Throat: Mucous membranes are moist.   Neck: No stridor. Painless ROM.  Cardiovascular: Normal rate, regular rhythm. Grossly normal heart sounds.  Good peripheral circulation. Respiratory: Normal respiratory effort.  No retractions. Lungs CTAB. Gastrointestinal: Soft with some mild ttp in LLQ. No distention. No abdominal bruits. No CVA tenderness. Genitourinary: deferred Musculoskeletal: No lower  extremity tenderness nor edema.  No joint effusions. Neurologic:  Normal speech and language. No gross focal neurologic deficits are appreciated. No facial droop Skin:  Skin is warm, dry and intact. No rash noted. Psychiatric: Mood and affect are normal. Speech and behavior are normal.  ____________________________________________   LABS (all labs ordered are listed, but only abnormal results are displayed)  Results for orders placed or performed during the hospital encounter of 03/19/19 (from the past 24 hour(s))  Lipase, blood     Status: None    Collection Time: 03/19/19  7:35 PM  Result Value Ref Range   Lipase 41 11 - 51 U/L  Comprehensive metabolic panel     Status: Abnormal   Collection Time: 03/19/19  7:35 PM  Result Value Ref Range   Sodium 141 135 - 145 mmol/L   Potassium 3.6 3.5 - 5.1 mmol/L   Chloride 111 98 - 111 mmol/L   CO2 23 22 - 32 mmol/L   Glucose, Bld 110 (H) 70 - 99 mg/dL   BUN 8 6 - 20 mg/dL   Creatinine, Ser 0.55 0.44 - 1.00 mg/dL   Calcium 8.4 (L) 8.9 - 10.3 mg/dL   Total Protein 6.5 6.5 - 8.1 g/dL   Albumin 3.8 3.5 - 5.0 g/dL   AST 16 15 - 41 U/L   ALT 13 0 - 44 U/L   Alkaline Phosphatase 36 (L) 38 - 126 U/L   Total Bilirubin 0.5 0.3 - 1.2 mg/dL   GFR calc non Af Amer >60 >60 mL/min   GFR calc Af Amer >60 >60 mL/min   Anion gap 7 5 - 15  CBC     Status: None   Collection Time: 03/19/19  7:35 PM  Result Value Ref Range   WBC 10.0 4.0 - 10.5 K/uL   RBC 4.83 3.87 - 5.11 MIL/uL   Hemoglobin 14.9 12.0 - 15.0 g/dL   HCT 44.0 36.0 - 46.0 %   MCV 91.1 80.0 - 100.0 fL   MCH 30.8 26.0 - 34.0 pg   MCHC 33.9 30.0 - 36.0 g/dL   RDW 13.7 11.5 - 15.5 %   Platelets 249 150 - 400 K/uL   nRBC 0.0 0.0 - 0.2 %  Urinalysis, Complete w Microscopic     Status: Abnormal   Collection Time: 03/19/19  7:35 PM  Result Value Ref Range   Color, Urine STRAW (A) YELLOW   APPearance CLEAR (A) CLEAR   Specific Gravity, Urine 1.005 1.005 - 1.030   pH 6.0 5.0 - 8.0   Glucose, UA NEGATIVE NEGATIVE mg/dL   Hgb urine dipstick LARGE (A) NEGATIVE   Bilirubin Urine NEGATIVE NEGATIVE   Ketones, ur NEGATIVE NEGATIVE mg/dL   Protein, ur NEGATIVE NEGATIVE mg/dL   Nitrite NEGATIVE NEGATIVE   Leukocytes,Ua NEGATIVE NEGATIVE   RBC / HPF 0-5 0 - 5 RBC/hpf   WBC, UA 6-10 0 - 5 WBC/hpf   Bacteria, UA NONE SEEN NONE SEEN   Squamous Epithelial / LPF 0-5 0 - 5  Pregnancy, urine POC     Status: None   Collection Time: 03/19/19  8:36 PM  Result Value Ref Range   Preg Test, Ur NEGATIVE NEGATIVE    ____________________________________________ ____________________________________________  RADIOLOGY  I personally reviewed all radiographic images ordered to evaluate for the above acute complaints and reviewed radiology reports and findings.  These findings were personally discussed with the patient.  Please see medical record for radiology report.  ____________________________________________   PROCEDURES  Procedure(s) performed:  Procedures    Critical Care performed: no ____________________________________________   INITIAL IMPRESSION / ASSESSMENT AND PLAN / ED COURSE  Pertinent labs & imaging results that were available during my care of the patient were reviewed by me and considered in my medical decision making (see chart for details).   DDX: stone, colitis, cyst, torsion, msk strain, constipation  Shelia Butler is a 33 y.o. who presents to the ED with symptoms as described above.  Blood work is reassuring.  Does have trace hemoglobin.  CT imaging ordered to evaluate for stone shows no acute abnormality.  Does not have any vaginal discharge.  Does not seem consistent with TOA or PID.  Will order ultrasound to exclude cyst or torsion.  Will sign patient out to oncoming physician pending results of ultrasound.     The patient was evaluated in Emergency Department today for the symptoms described in the history of present illness. He/she was evaluated in the context of the global COVID-19 pandemic, which necessitated consideration that the patient might be at risk for infection with the SARS-CoV-2 virus that causes COVID-19. Institutional protocols and algorithms that pertain to the evaluation of patients at risk for COVID-19 are in a state of rapid change based on information released by regulatory bodies including the CDC and federal and state organizations. These policies and algorithms were followed during the patient's care in the ED.  As part of my medical decision  making, I reviewed the following data within the electronic MEDICAL RECORD NUMBER Nursing notes reviewed and incorporated, Labs reviewed, notes from prior ED visits and [place] Controlled Substance Database   ____________________________________________   FINAL CLINICAL IMPRESSION(S) / ED DIAGNOSES  Final diagnoses:  Left sided abdominal pain      NEW MEDICATIONS STARTED DURING THIS VISIT:  New Prescriptions   No medications on file     Note:  This document was prepared using Dragon voice recognition software and may include unintentional dictation errors.    Willy Eddy, MD 03/19/19 Shelia Butler    Willy Eddy, MD 03/20/19 0001

## 2019-03-19 NOTE — ED Notes (Signed)
Patient transported to CT 

## 2019-03-20 ENCOUNTER — Emergency Department: Payer: Self-pay

## 2019-03-20 MED ORDER — HALOPERIDOL LACTATE 5 MG/ML IJ SOLN
5.0000 mg | Freq: Once | INTRAMUSCULAR | Status: AC
  Administered 2019-03-20: 02:00:00 5 mg via INTRAVENOUS
  Filled 2019-03-20: qty 1

## 2019-03-20 MED ORDER — DICYCLOMINE HCL 20 MG PO TABS: 20.0000 mg | ORAL_TABLET | Freq: Three times a day (TID) | ORAL | 0 refills | Status: DC | PRN

## 2019-03-20 NOTE — Discharge Instructions (Signed)
Please seek medical attention for any high fevers, chest pain, shortness of breath, change in behavior, persistent vomiting, bloody stool or any other new or concerning symptoms.  

## 2019-03-20 NOTE — ED Notes (Signed)
Patient resting quietly in no acute distress.  

## 2019-10-14 ENCOUNTER — Emergency Department
Admission: EM | Admit: 2019-10-14 | Discharge: 2019-10-14 | Disposition: A | Discharge status: Home / Self Care | Payer: Self-pay | Attending: Emergency Medicine | Admitting: Emergency Medicine

## 2019-10-14 ENCOUNTER — Emergency Department: Payer: Self-pay

## 2019-10-14 ENCOUNTER — Other Ambulatory Visit: Payer: Self-pay

## 2019-10-14 DIAGNOSIS — Z5321 Procedure and treatment not carried out due to patient leaving prior to being seen by health care provider: Secondary | ICD-10-CM | POA: Insufficient documentation

## 2019-10-14 DIAGNOSIS — J029 Acute pharyngitis, unspecified: Secondary | ICD-10-CM | POA: Insufficient documentation

## 2019-10-14 DIAGNOSIS — R05 Cough: Secondary | ICD-10-CM | POA: Insufficient documentation

## 2019-10-14 NOTE — ED Triage Notes (Signed)
Patient c/o sore throat and productive cough X 3 days.

## 2019-10-15 ENCOUNTER — Encounter: Payer: Self-pay | Admitting: Emergency Medicine

## 2019-10-15 ENCOUNTER — Other Ambulatory Visit: Payer: Self-pay

## 2019-10-15 ENCOUNTER — Ambulatory Visit
Admission: EM | Admit: 2019-10-15 | Discharge: 2019-10-15 | Disposition: A | Discharge status: Home / Self Care | Payer: Self-pay | Attending: Family Medicine | Admitting: Family Medicine

## 2019-10-15 DIAGNOSIS — Z833 Family history of diabetes mellitus: Secondary | ICD-10-CM | POA: Insufficient documentation

## 2019-10-15 DIAGNOSIS — Z79899 Other long term (current) drug therapy: Secondary | ICD-10-CM | POA: Insufficient documentation

## 2019-10-15 DIAGNOSIS — F17210 Nicotine dependence, cigarettes, uncomplicated: Secondary | ICD-10-CM | POA: Insufficient documentation

## 2019-10-15 DIAGNOSIS — Z8249 Family history of ischemic heart disease and other diseases of the circulatory system: Secondary | ICD-10-CM | POA: Insufficient documentation

## 2019-10-15 DIAGNOSIS — Z20822 Contact with and (suspected) exposure to covid-19: Secondary | ICD-10-CM | POA: Insufficient documentation

## 2019-10-15 DIAGNOSIS — J45909 Unspecified asthma, uncomplicated: Secondary | ICD-10-CM | POA: Insufficient documentation

## 2019-10-15 DIAGNOSIS — Z803 Family history of malignant neoplasm of breast: Secondary | ICD-10-CM | POA: Insufficient documentation

## 2019-10-15 DIAGNOSIS — J069 Acute upper respiratory infection, unspecified: Secondary | ICD-10-CM

## 2019-10-15 DIAGNOSIS — J029 Acute pharyngitis, unspecified: Secondary | ICD-10-CM

## 2019-10-15 LAB — GROUP A STREP BY PCR: Group A Strep by PCR: NOT DETECTED

## 2019-10-15 MED ORDER — HYDROCOD POLST-CPM POLST ER 10-8 MG/5ML PO SUER: 5.0000 mL | Freq: Two times a day (BID) | ORAL | 0 refills | Status: DC | PRN

## 2019-10-15 NOTE — Discharge Instructions (Signed)
Rest, fluids, over the counter tylenol/advil °

## 2019-10-15 NOTE — ED Triage Notes (Signed)
Patient c/o cough, sore throat, headache and runny nose that started 3 days ago.  Patient went to the ED yesterday but did not stay to be seen.  Patient denies fevers.

## 2019-10-15 NOTE — ED Provider Notes (Signed)
[facility]    CSN: [id number] Arrival date & time: 10/15/19  0802      History   Chief Complaint Chief Complaint  Patient presents with  . Cough  . Sore Throat  . Nasal Congestion    HPI Shelia Butler is a 34 y.o. female.   34 yo female with a c/o cough, sore throat, runny nose, headache for the past 3 days. Denies any fevers, chills, shortness of breath, wheezing. Possible covid contact.    Cough Sore Throat    Past Medical History:  Diagnosis Date  . Anxiety   . Asthma     There are no problems to display for this patient.   Past Surgical History:  Procedure Laterality Date  . LEEP      OB History   No obstetric history on file.      Home Medications    Prior to Admission medications   Medication Sig Start Date End Date Taking? Authorizing Provider  albuterol (PROVENTIL HFA;VENTOLIN HFA) 108 (90 Base) MCG/ACT inhaler Inhale 1-2 puffs into the lungs every 6 (six) hours as needed for wheezing or shortness of breath. 08/01/18   Lamptey, Myrene Galas, MD  azithromycin (ZITHROMAX Z-PAK) 250 MG tablet Please take 2 tablets orally on day 1 and then 1 tablet orally daily from day 2 through day 5. 08/02/18   Lamptey, Myrene Galas, MD  benzonatate (TESSALON) 100 MG capsule Take 1 capsule (100 mg total) by mouth every 8 (eight) hours. 08/01/18   LampteyMyrene Galas, MD  chlorpheniramine-HYDROcodone (TUSSIONEX PENNKINETIC ER) 10-8 MG/5ML SUER Take 5 mLs by mouth every 12 (twelve) hours as needed. 10/15/19   Norval Gable, MD  dicyclomine (BENTYL) 20 MG tablet Take 1 tablet (20 mg total) by mouth 3 (three) times daily as needed. 03/20/19   Nance Pear, MD  predniSONE (DELTASONE) 20 MG tablet Take 2 tablets (40 mg) daily by mouth 08/04/18   Lorin Picket, PA-C    Family History Family History  Problem Relation Age of Onset  . Breast cancer Mother   . Heart disease Mother   . Heart attack Mother 66  . Diabetes Father   . Osteoarthritis Father   . Alcohol  abuse Father     Social History Social History   Tobacco Use  . Smoking status: Current Every Day Smoker    Packs/day: 0.50    Years: 15.00    Pack years: 7.50    Types: Cigarettes  . Smokeless tobacco: Never Used  Substance Use Topics  . Alcohol use: Yes    Comment: occassional  . Drug use: Yes    Frequency: 10.0 times per week    Types: Marijuana    Comment: 1-2 times per day     Allergies   Patient has no known allergies.   Review of Systems Review of Systems  Respiratory: Positive for cough.      Physical Exam Triage Vital Signs ED Triage Vitals  Enc Vitals Group     BP 10/15/19 0815 127/77     Pulse Rate 10/15/19 0815 80     Resp 10/15/19 0815 14     Temp 10/15/19 0815 98.6 F (37 C)     Temp Source 10/15/19 0815 Oral     SpO2 10/15/19 0815 100 %     Weight 10/15/19 0810 165 lb (74.8 kg)     Height 10/15/19 0810 5\' 5"  (1.651 m)     Head Circumference --  Peak Flow --      Pain Score 10/15/19 0810 10     Pain Loc --      Pain Edu? --      Excl. in GC? --    No data found.  Updated Vital Signs BP 127/77 (BP Location: Left Arm)   Pulse 80   Temp 98.6 F (37 C) (Oral)   Resp 14   Ht 5\' 5"  (1.651 m)   Wt 74.8 kg   LMP 09/24/2019   SpO2 100%   BMI 27.46 kg/m   Visual Acuity Right Eye Distance:   Left Eye Distance:   Bilateral Distance:    Right Eye Near:   Left Eye Near:    Bilateral Near:     Physical Exam Vitals and nursing note reviewed.  Constitutional:      General: She is not in acute distress.    Appearance: She is not toxic-appearing or diaphoretic.  HENT:     Right Ear: Tympanic membrane normal.     Left Ear: Tympanic membrane normal.     Mouth/Throat:     Pharynx: Posterior oropharyngeal erythema present. No oropharyngeal exudate.  Cardiovascular:     Rate and Rhythm: Normal rate.     Heart sounds: Normal heart sounds.  Pulmonary:     Effort: Pulmonary effort is normal. No respiratory distress.     Breath sounds:  Normal breath sounds. No stridor. No wheezing, rhonchi or rales.  Musculoskeletal:     Cervical back: Neck supple.  Skin:    Findings: No rash.  Neurological:     Mental Status: She is alert.      UC Treatments / Results  Labs (all labs ordered are listed, but only abnormal results are displayed) Labs Reviewed  GROUP A STREP BY PCR  SARS CORONAVIRUS 2 (TAT 6-24 HRS)    EKG   Radiology No results found.  Procedures Procedures (including critical care time)  Medications Ordered in UC Medications - No data to display  Initial Impression / Assessment and Plan / UC Course  I have reviewed the triage vital signs and the nursing notes.  Pertinent labs & imaging results that were available during my care of the patient were reviewed by me and considered in my medical decision making (see chart for details).      Final Clinical Impressions(s) / UC Diagnoses   Final diagnoses:  Viral URI with cough  Pharyngitis, unspecified etiology     Discharge Instructions     Rest, fluids, over the counter tylenol/advil    ED Prescriptions    Medication Sig Dispense Auth. Provider   chlorpheniramine-HYDROcodone (TUSSIONEX PENNKINETIC ER) 10-8 MG/5ML SUER Take 5 mLs by mouth every 12 (twelve) hours as needed. 60 mL 09/26/2019, MD      1. diagnosis reviewed with patient 2. rx as per orders above; reviewed possible side effects, interactions, risks and benefits  3. Recommend supportive treatment as above 4. Follow-up prn if symptoms worsen or don't improve   I have reviewed the PDMP during this encounter.   Payton Mccallum, MD 10/15/19 [phone]

## 2019-10-16 LAB — SARS CORONAVIRUS 2 (TAT 6-24 HRS): SARS Coronavirus 2: NEGATIVE

## 2021-01-28 ENCOUNTER — Other Ambulatory Visit: Payer: Self-pay

## 2021-01-28 ENCOUNTER — Ambulatory Visit
Admission: EM | Admit: 2021-01-28 | Discharge: 2021-01-28 | Disposition: A | Discharge status: Home / Self Care | Payer: Self-pay | Attending: Emergency Medicine | Admitting: Emergency Medicine

## 2021-01-28 DIAGNOSIS — B349 Viral infection, unspecified: Secondary | ICD-10-CM

## 2021-01-28 DIAGNOSIS — R112 Nausea with vomiting, unspecified: Secondary | ICD-10-CM

## 2021-01-28 DIAGNOSIS — R197 Diarrhea, unspecified: Secondary | ICD-10-CM

## 2021-01-28 MED ORDER — ONDANSETRON 8 MG PO TBDP
8.0000 mg | ORAL_TABLET | Freq: Once | ORAL | Status: AC
  Administered 2021-01-28: 8 mg via ORAL

## 2021-01-28 MED ORDER — ONDANSETRON 4 MG PO TBDP: 4.0000 mg | ORAL_TABLET | Freq: Three times a day (TID) | ORAL | 0 refills | Status: AC | PRN

## 2021-01-28 NOTE — ED Triage Notes (Addendum)
Pt states she had "bubble gut" yesterday and then ate a biscuit and started with n/v/d around 1:00 pm.  Feels hot and cold. Dry heaves today. Husband is sick with same and same timeframe.

## 2021-01-28 NOTE — Discharge Instructions (Addendum)
Rest,push fluids, follow up with PCP. Do not take pepto bismol

## 2021-01-28 NOTE — ED Provider Notes (Signed)
[facility]    CSN: [id number] Arrival date & time: 01/28/21  1448      History   Chief Complaint Chief Complaint  Patient presents with   Emesis    diarr   Diarrhea    HPI Shelia Butler is a 35 y.o. female.   35 year old female, Shelia Butler, presents to urgent care chief complaint of nausea vomiting diarrhea since 1 PM yesterday.  Patient states her husband has the same symptoms.  Patient reports using Pepto-Bismol for symptoms which were not effective, +"burping"  The history is provided by the patient. No language interpreter was used.   Past Medical History:  Diagnosis Date   Anxiety    Asthma     Patient Active Problem List   Diagnosis Date Noted   Nonspecific syndrome suggestive of viral illness 01/28/2021   Nausea vomiting and diarrhea 01/28/2021    Past Surgical History:  Procedure Laterality Date   LEEP      OB History   No obstetric history on file.      Home Medications    Prior to Admission medications   Medication Sig Start Date End Date Taking? Authorizing Provider  ondansetron (ZOFRAN ODT) 4 MG disintegrating tablet Take 1 tablet (4 mg total) by mouth every 8 (eight) hours as needed for up to 3 days for nausea or vomiting. 01/28/21 01/31/21 Yes Glendal Cassaday, Para March, NP  albuterol (PROVENTIL HFA;VENTOLIN HFA) 108 (90 Base) MCG/ACT inhaler Inhale 1-2 puffs into the lungs every 6 (six) hours as needed for wheezing or shortness of breath. 08/01/18   Lamptey, Britta Mccreedy, MD  azithromycin (ZITHROMAX Z-PAK) 250 MG tablet Please take 2 tablets orally on day 1 and then 1 tablet orally daily from day 2 through day 5. 08/02/18   Lamptey, Britta Mccreedy, MD  benzonatate (TESSALON) 100 MG capsule Take 1 capsule (100 mg total) by mouth every 8 (eight) hours. 08/01/18   LampteyBritta Mccreedy, MD  chlorpheniramine-HYDROcodone (TUSSIONEX PENNKINETIC ER) 10-8 MG/5ML SUER Take 5 mLs by mouth every 12 (twelve) hours as needed. 10/15/19   Payton Mccallum, MD  dicyclomine  (BENTYL) 20 MG tablet Take 1 tablet (20 mg total) by mouth 3 (three) times daily as needed. 03/20/19   Phineas Semen, MD  predniSONE (DELTASONE) 20 MG tablet Take 2 tablets (40 mg) daily by mouth 08/04/18   Lutricia Feil, PA-C    Family History Family History  Problem Relation Age of Onset   Breast cancer Mother    Heart disease Mother    Heart attack Mother 19   Diabetes Father    Osteoarthritis Father    Alcohol abuse Father     Social History Social History   Tobacco Use   Smoking status: Every Day    Packs/day: 0.50    Years: 15.00    Pack years: 7.50    Types: Cigarettes   Smokeless tobacco: Never  Vaping Use   Vaping Use: Never used  Substance Use Topics   Alcohol use: Yes    Comment: occassional   Drug use: Yes    Frequency: 10.0 times per week    Types: Marijuana    Comment: last use 3 days ago     Allergies   Patient has no known allergies.   Review of Systems Review of Systems  Constitutional:  Negative for fever.  Gastrointestinal:  Positive for diarrhea, nausea and vomiting.  Genitourinary:  Negative for dysuria.  All other systems reviewed and are negative.  Physical Exam Triage Vital Signs ED Triage Vitals  Enc Vitals Group     BP 01/28/21 0930 113/76     Pulse Rate 01/28/21 0930 87     Resp 01/28/21 0930 16     Temp 01/28/21 0930 98.4 F (36.9 C)     Temp Source 01/28/21 0930 Oral     SpO2 01/28/21 0930 100 %     Weight 01/28/21 0929 145 lb (65.8 kg)     Height 01/28/21 0929 5\' 5"  (1.651 m)     Head Circumference --      Peak Flow --      Pain Score 01/28/21 0928 4     Pain Loc --      Pain Edu? --      Excl. in GC? --    No data found.  Updated Vital Signs BP 113/76 (BP Location: Right Arm)   Pulse 87   Temp 98.4 F (36.9 C) (Oral)   Resp 16   Ht 5\' 5"  (1.651 m)   Wt 145 lb (65.8 kg)   LMP 01/14/2021   SpO2 100%   BMI 24.13 kg/m   Visual Acuity Right Eye Distance:   Left Eye Distance:   Bilateral Distance:     Right Eye Near:   Left Eye Near:    Bilateral Near:     Physical Exam Vitals and nursing note reviewed.  Constitutional:      Appearance: Normal appearance. She is well-developed.  HENT:     Head: Normocephalic.     Nose: Nose normal.  Eyes:     Pupils: Pupils are equal, round, and reactive to light.  Cardiovascular:     Rate and Rhythm: Normal rate and regular rhythm.     Pulses: Normal pulses.     Heart sounds: Normal heart sounds.  Pulmonary:     Effort: Pulmonary effort is normal.     Breath sounds: Normal breath sounds and air entry.  Abdominal:     General: Bowel sounds are normal.     Palpations: Abdomen is soft.     Tenderness: There is no abdominal tenderness. There is no rebound.  Musculoskeletal:        General: Normal range of motion.     Cervical back: Normal range of motion.  Skin:    General: Skin is warm.     Capillary Refill: Capillary refill takes less than 2 seconds.  Neurological:     General: No focal deficit present.     Mental Status: She is alert and oriented to person, place, and time.     GCS: GCS eye subscore is 4. GCS verbal subscore is 5. GCS motor subscore is 6.  Psychiatric:        Attention and Perception: Attention normal.        Mood and Affect: Mood normal.        Speech: Speech normal.        Behavior: Behavior normal. Behavior is cooperative.     UC Treatments / Results  Labs (all labs ordered are listed, but only abnormal results are displayed) Labs Reviewed - No data to display  EKG   Radiology No results found.  Procedures Procedures (including critical care time)  Medications Ordered in UC Medications  ondansetron (ZOFRAN-ODT) disintegrating tablet 8 mg (8 mg Oral Given 01/28/21 1017)    Initial Impression / Assessment and Plan / UC Course  I have reviewed the triage vital signs and the nursing notes.  Pertinent labs &  imaging results that were available during my care of the patient were reviewed by me and  considered in my medical decision making (see chart for details).     DDX: Viral gastroenteritis, food poisoning, COVID Final Clinical Impressions(s) / UC Diagnoses   Final diagnoses:  Nonspecific syndrome suggestive of viral illness  Nausea vomiting and diarrhea     Discharge Instructions      Rest,push fluids, follow up with PCP. Do not take pepto bismol     ED Prescriptions     Medication Sig Dispense Auth. Provider   ondansetron (ZOFRAN ODT) 4 MG disintegrating tablet Take 1 tablet (4 mg total) by mouth every 8 (eight) hours as needed for up to 3 days for nausea or vomiting. 9 tablet Naythan Douthit, Para March, NP      PDMP not reviewed this encounter.   Clancy Gourd, NP 01/28/21 1356

## 2021-02-21 IMAGING — CT CT RENAL STONE PROTOCOL
3 of 4 series · 9 of 46 positions shown, 16 images · non-contrast
Comparison: CT of the abdomen pelvis dated 02/02/2014

CLINICAL DATA: 33-year-old female with flank pain. Concern for
kidney stone.

EXAM:
CT ABDOMEN AND PELVIS WITHOUT CONTRAST
TECHNIQUE: Multidetector CT imaging of the abdomen and pelvis was performed
following the standard protocol without IV contrast.

[Series 4: lung bases · axial · 0.75mm/px · z∈[-462,-382]mm · 5 of 26 slices shown, 10 images]
[im 5/26  soft-tissue]
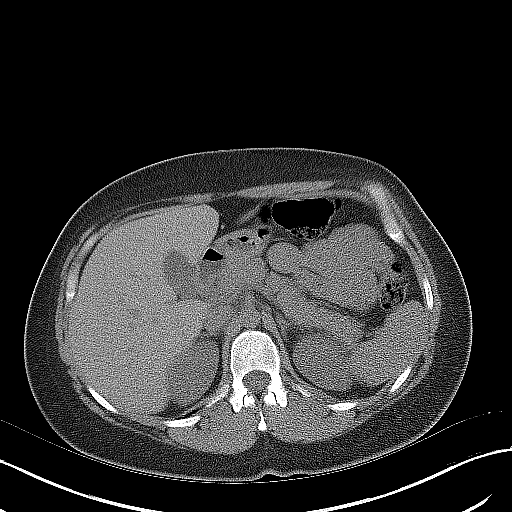
[im 5/26  bone]
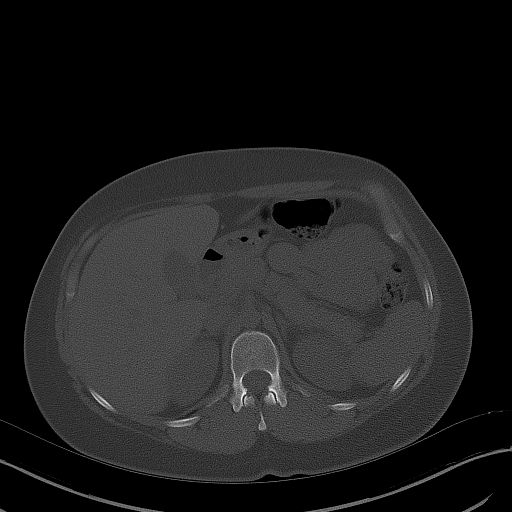
[im 9/26  soft-tissue]
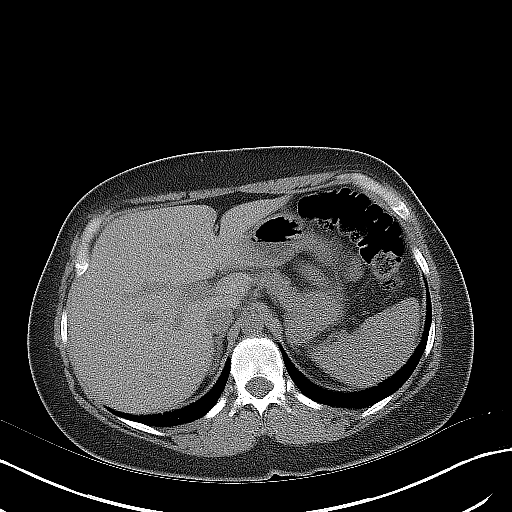
[im 9/26  lung]
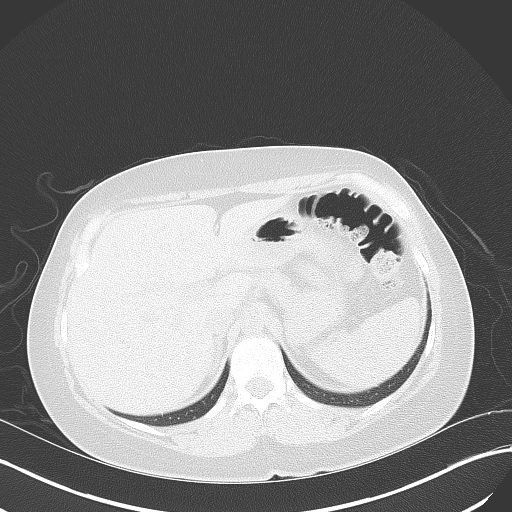
[im 13/26  soft-tissue]
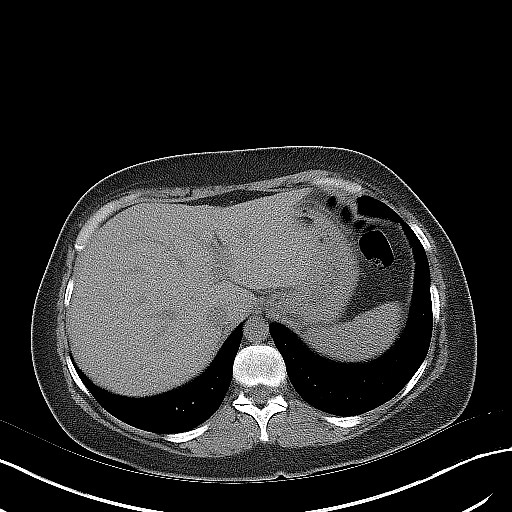
[im 13/26  lung]
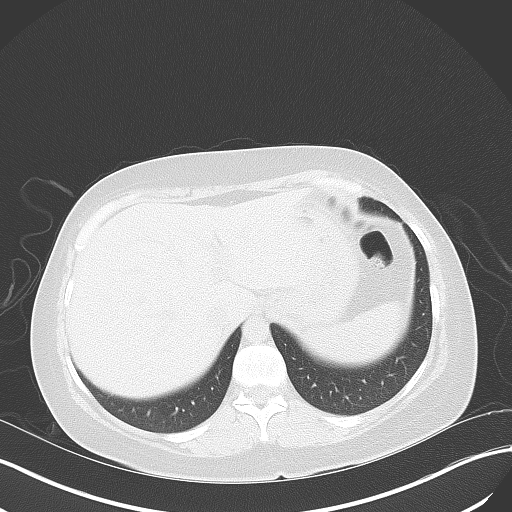
[im 17/26  soft-tissue]
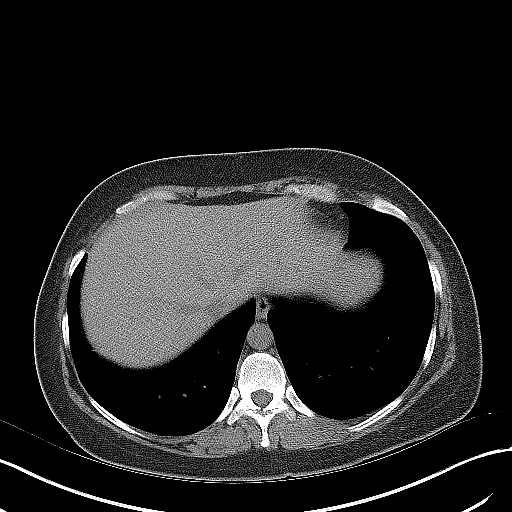
[im 17/26  lung]
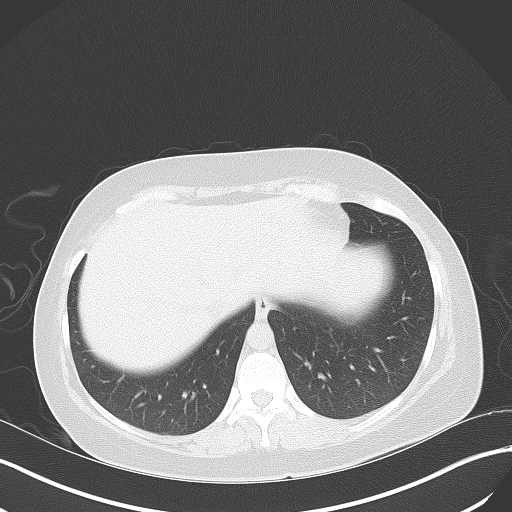
[im 21/26  soft-tissue]
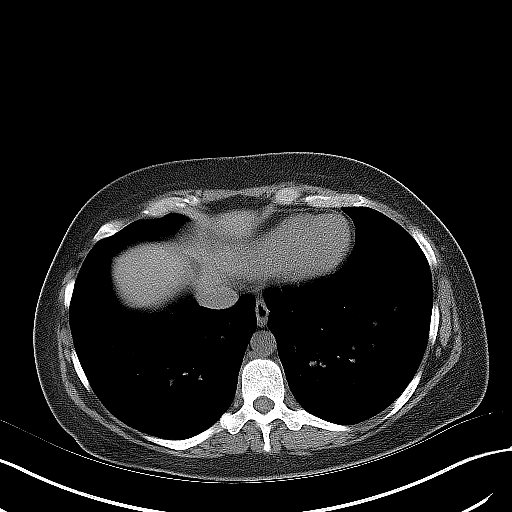
[im 21/26  lung]
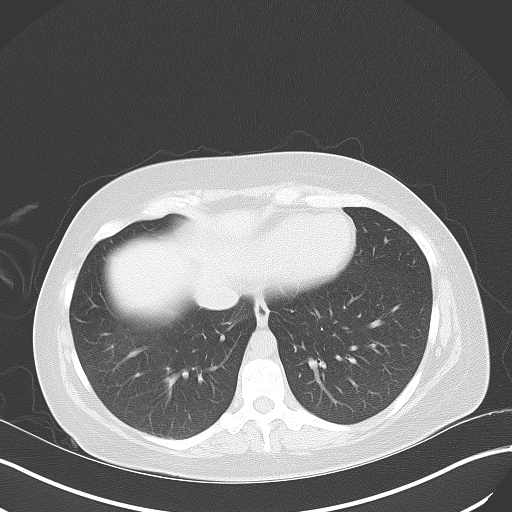

[Series 5: coronal · coronal · 0.84mm/px · 3 of 122 slices shown, 4 images]
[im 41/122  soft-tissue]
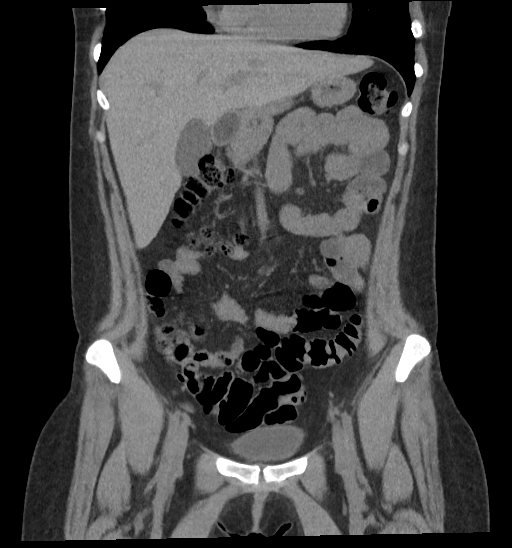
[im 54/122  soft-tissue]
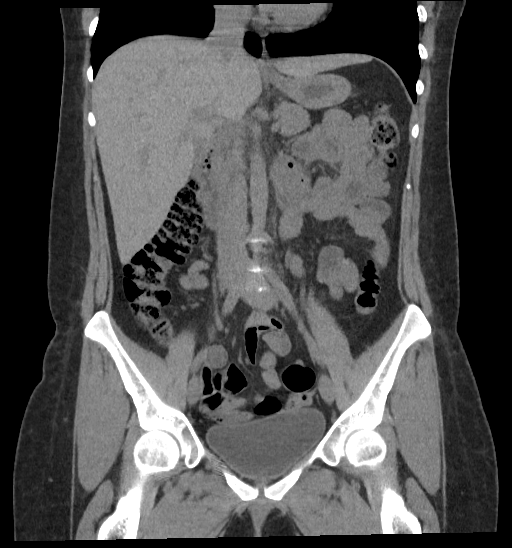
[im 54/122  bone]
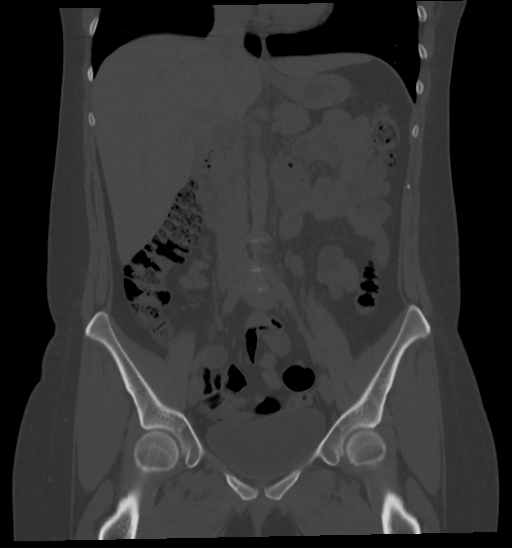
[im 68/122  soft-tissue]
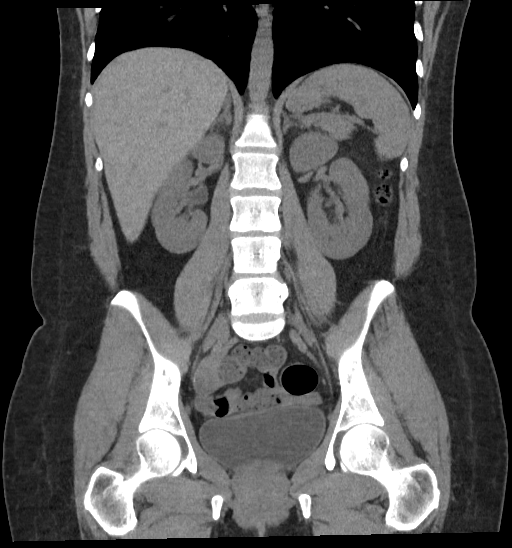

[Series 6: sagittal · sagittal · 0.51mm/px · 1 of 205 slices shown, 2 images]
[im 69/205  soft-tissue]
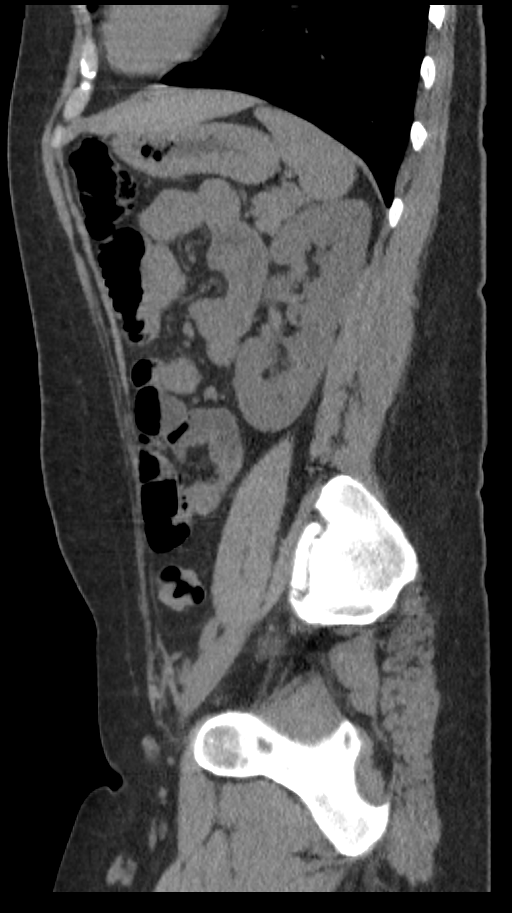
[im 69/205  bone]
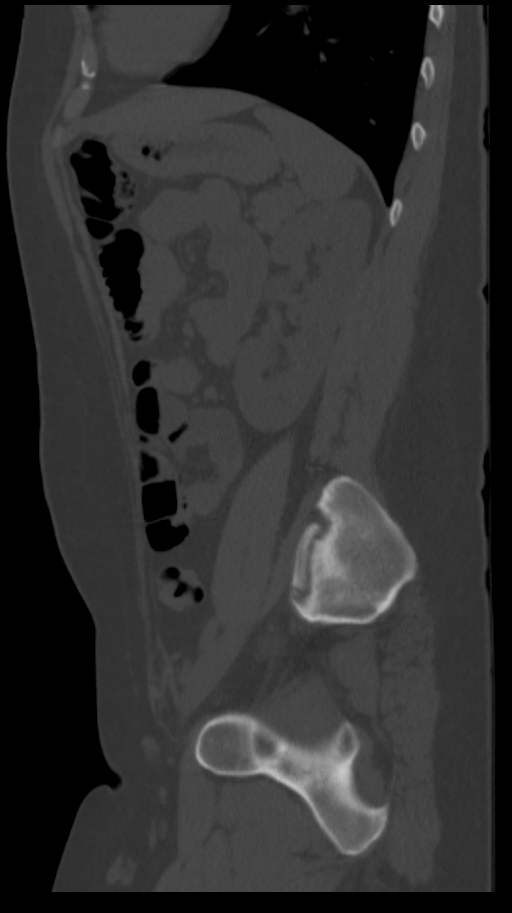

[9 of 46 positions shown; findings below may reference images not displayed]

FINDINGS: Evaluation of this exam is limited in the absence of intravenous
contrast.

Lower chest: The visualized lung bases are clear.

No intra-abdominal free air or free fluid.

Hepatobiliary: No focal liver abnormality is seen. No gallstones,
gallbladder wall thickening, or biliary dilatation.

Pancreas: Minimal peripancreatic haziness. Correlation with
pancreatic enzymes recommended to exclude pancreatitis.

Spleen: Normal in size without focal abnormality.

Adrenals/Urinary Tract: The adrenal glands are unremarkable. The
kidneys, visualized ureters, and urinary bladder appear
unremarkable.

Stomach/Bowel: There is no bowel obstruction or active inflammation.
Normal appendix.

Vascular/Lymphatic: The abdominal aorta and IVC are grossly
unremarkable on this noncontrast CT. No portal venous gas. There is
no adenopathy.

Reproductive: The uterus and ovaries are grossly unremarkable. No
adnexal masses.

Other: None

Musculoskeletal: No acute or significant osseous findings.
IMPRESSION: 1. No hydronephrosis or nephrolithiasis.
2. Minimal peripancreatic haziness. Correlation with pancreatic
enzymes recommended to exclude pancreatitis.
3. No bowel obstruction or active inflammation. Normal appendix.

## 2021-11-03 ENCOUNTER — Ambulatory Visit
Admission: EM | Admit: 2021-11-03 | Discharge: 2021-11-03 | Disposition: A | Discharge status: Home / Self Care | Payer: Self-pay | Attending: Internal Medicine | Admitting: Internal Medicine

## 2021-11-03 DIAGNOSIS — N926 Irregular menstruation, unspecified: Secondary | ICD-10-CM | POA: Insufficient documentation

## 2021-11-03 DIAGNOSIS — N760 Acute vaginitis: Secondary | ICD-10-CM | POA: Insufficient documentation

## 2021-11-03 DIAGNOSIS — N939 Abnormal uterine and vaginal bleeding, unspecified: Secondary | ICD-10-CM | POA: Insufficient documentation

## 2021-11-03 LAB — WET PREP, GENITAL
Clue Cells Wet Prep HPF POC: NONE SEEN
Sperm: NONE SEEN
Trich, Wet Prep: NONE SEEN
WBC, Wet Prep HPF POC: <10 — AB (ref <10)
Yeast Wet Prep HPF POC: NONE SEEN

## 2021-11-03 LAB — URINALYSIS, ROUTINE W REFLEX MICROSCOPIC
Glucose, UA: NEGATIVE mg/dL
Ketones, ur: NEGATIVE mg/dL
Leukocytes,Ua: NEGATIVE
Nitrite: NEGATIVE
Protein, ur: 30 mg/dL — AB
Specific Gravity, Urine: 1.025 (ref 1.005–1.030)
pH: 6 (ref 5.0–8.0)

## 2021-11-03 LAB — URINALYSIS, MICROSCOPIC (REFLEX): WBC, UA: NONE SEEN WBC/hpf (ref 0–5)

## 2021-11-03 LAB — PREGNANCY, URINE: Preg Test, Ur: NEGATIVE

## 2021-11-03 MED ORDER — METRONIDAZOLE 0.75 % VA GEL: 1.0000 | Freq: Every day | VAGINAL | 0 refills | Status: DC

## 2021-11-03 NOTE — ED Triage Notes (Signed)
Patient presents to UC   Patient c.o having a light period about 2 weeks ago -- this is abnormal for her.  Monday she noticed a smell -- she did a @ home treatment for yeast infection.  Has intercourse Friday -- bleeding again -- bright red blood and very strong order.

## 2021-11-03 NOTE — Discharge Instructions (Addendum)
[facility] Address: [address] [place], [place] [address] Phone: [phone]  Vena Austria, MD Address: [address] [place], [place] [address] Phone: [phone]

## 2021-11-03 NOTE — ED Provider Notes (Signed)
[facility]    CSN: [id number] Arrival date & time: 11/03/21  0800      History   Chief Complaint No chief complaint on file.   HPI Shelia Butler is a 36 y.o. female who presents with vaginal fishy order x 1 week ago. She treated herself for yeast. She had sex with her husband 3 days ago and began bleeding again like a period and the fishy vaginal order was worse. She denies using tampons since she was a teen. Also her LMP on 5/10 was very light which is unusual for her. She is not on birth control and has not been able to get pregnant with her husband since her last daughter 13 years ago. Her last pap was 2 years ago.     Past Medical History:  Diagnosis Date   Anxiety    Asthma     Patient Active Problem List   Diagnosis Date Noted   Nonspecific syndrome suggestive of viral illness 01/28/2021   Nausea vomiting and diarrhea 01/28/2021    Past Surgical History:  Procedure Laterality Date   LEEP      OB History   No obstetric history on file.      Home Medications    Prior to Admission medications   Medication Sig Start Date End Date Taking? Authorizing Provider  metroNIDAZOLE (METROGEL VAGINAL) 0.75 % vaginal gel Place 1 Applicatorful vaginally at bedtime. For 5 night 11/03/21  Yes Rodriguez-Southworth, Nettie Elm, PA-C  albuterol (PROVENTIL HFA;VENTOLIN HFA) 108 (90 Base) MCG/ACT inhaler Inhale 1-2 puffs into the lungs every 6 (six) hours as needed for wheezing or shortness of breath. 08/01/18   Lamptey, Britta Mccreedy, MD    Family History Family History  Problem Relation Age of Onset   Breast cancer Mother    Heart disease Mother    Heart attack Mother 29   Diabetes Father    Osteoarthritis Father    Alcohol abuse Father     Social History Social History   Tobacco Use   Smoking status: Every Day    Packs/day: 0.50    Years: 15.00    Pack years: 7.50    Types: Cigarettes   Smokeless tobacco: Never  Vaping Use   Vaping Use: Never used  Substance  Use Topics   Alcohol use: Yes    Comment: occassional   Drug use: Yes    Frequency: 10.0 times per week    Types: Marijuana    Comment: last use 3 days ago     Allergies   Patient has no known allergies.   Review of Systems Review of Systems  Constitutional:  Negative for fever.  Genitourinary:  Positive for menstrual problem and vaginal bleeding. Negative for dysuria, frequency, pelvic pain and vaginal discharge.       Has vaginal fishy odor    Physical Exam Triage Vital Signs ED Triage Vitals  Enc Vitals Group     BP 11/03/21 0813 117/64     Pulse Rate 11/03/21 0813 71     Resp 11/03/21 0813 20     Temp 11/03/21 0813 98.2 F (36.8 C)     Temp Source 11/03/21 0813 Oral     SpO2 11/03/21 0813 100 %     Weight 11/03/21 0812 130 lb (59 kg)     Height 11/03/21 0812 5\' 5"  (1.651 m)     Head Circumference --      Peak Flow --      Pain Score 11/03/21 0811 2  Pain Loc --      Pain Edu? --      Excl. in GC? --    No data found.  Updated Vital Signs BP 117/64 (BP Location: Right Arm)   Pulse 71   Temp 98.2 F (36.8 C) (Oral)   Resp 20   Ht 5\' 5"  (1.651 m)   Wt 130 lb (59 kg)   LMP 10/08/2021 (Approximate)   SpO2 100%   BMI 21.63 kg/m   Visual Acuity Right Eye Distance:   Left Eye Distance:   Bilateral Distance:    Right Eye Near:   Left Eye Near:    Bilateral Near:     Physical Exam Constitutional:      Appearance: Normal appearance.  HENT:     Right Ear: External ear normal.     Left Ear: External ear normal.  Eyes:     General: No scleral icterus.    Conjunctiva/sclera: Conjunctivae normal.  Pulmonary:     Effort: Pulmonary effort is normal.  Genitourinary:    Comments: declined Musculoskeletal:        General: Normal range of motion.     Cervical back: Neck supple.  Neurological:     Mental Status: She is alert and oriented to person, place, and time.     Gait: Gait normal.  Psychiatric:        Mood and Affect: Mood normal.         Behavior: Behavior normal.        Thought Content: Thought content normal.        Judgment: Judgment normal.     UC Treatments / Results  Labs (all labs ordered are listed, but only abnormal results are displayed) Labs Reviewed  WET PREP, GENITAL - Abnormal; Notable for the following components:      Result Value   WBC, Wet Prep HPF POC <10 (*)    All other components within normal limits  URINALYSIS, ROUTINE W REFLEX MICROSCOPIC - Abnormal; Notable for the following components:   Color, Urine AMBER (*)    APPearance CLOUDY (*)    Hgb urine dipstick LARGE (*)    Bilirubin Urine SMALL (*)    Protein, ur 30 (*)    All other components within normal limits  URINALYSIS, MICROSCOPIC (REFLEX) - Abnormal; Notable for the following components:   Bacteria, UA RARE (*)    All other components within normal limits  PREGNANCY, URINE   Urine pregnancy test negative   EKG   Radiology No results found.  Procedures Procedures (including critical care time)  Medications Ordered in UC Medications - No data to display  Initial Impression / Assessment and Plan / UC Course  I have reviewed the triage vital signs and the nursing notes.  Pertinent labs  results that were available during my care of the patient were reviewed by me and considered in my medical decision making (see chart for details).  Abnormal vaginal bleeding Hx of abnormal pap in the past with pre-cancerous cells Fishy vaginal odor suspect of BV, but wet mount is neg  I will go ahead and treat her for BV Recommended her to get her pap done, different GYN offices given for her to make appt. She agreed.  Final Clinical Impressions(s) / UC Diagnoses   Final diagnoses:  Abnormal menstrual periods  Abnormal vaginal bleeding  Acute vaginitis     Discharge Instructions      [facility] Address: [address] [place] [address], [place], [place] [address] Phone: (  [phone]  Vena Austria, MD Address: [address],  [place], [place] [address] Phone: [phone]     ED Prescriptions     Medication Sig Dispense Auth. Provider   metroNIDAZOLE (METROGEL VAGINAL) 0.75 % vaginal gel Place 1 Applicatorful vaginally at bedtime. For 5 night 70 g Rodriguez-Southworth, Nettie Elm, PA-C      PDMP not reviewed this encounter.   Garey Ham, [place] 11/03/21 [phone]

## 2021-11-04 ENCOUNTER — Ambulatory Visit: Payer: Self-pay

## 2022-01-13 ENCOUNTER — Ambulatory Visit: Payer: Self-pay

## 2022-06-30 ENCOUNTER — Emergency Department
Admission: EM | Admit: 2022-06-30 | Discharge: 2022-07-01 | Disposition: A | Discharge status: Home / Self Care | Payer: Self-pay | Attending: Emergency Medicine | Admitting: Emergency Medicine

## 2022-06-30 DIAGNOSIS — J45909 Unspecified asthma, uncomplicated: Secondary | ICD-10-CM | POA: Insufficient documentation

## 2022-06-30 DIAGNOSIS — F17210 Nicotine dependence, cigarettes, uncomplicated: Secondary | ICD-10-CM | POA: Insufficient documentation

## 2022-06-30 DIAGNOSIS — F15159 Other stimulant abuse with stimulant-induced psychotic disorder, unspecified: Secondary | ICD-10-CM | POA: Insufficient documentation

## 2022-06-30 DIAGNOSIS — F22 Delusional disorders: Secondary | ICD-10-CM

## 2022-06-30 DIAGNOSIS — R4584 Anhedonia: Secondary | ICD-10-CM | POA: Insufficient documentation

## 2022-06-30 DIAGNOSIS — F600 Paranoid personality disorder: Secondary | ICD-10-CM | POA: Insufficient documentation

## 2022-06-30 DIAGNOSIS — F15959 Other stimulant use, unspecified with stimulant-induced psychotic disorder, unspecified: Secondary | ICD-10-CM | POA: Diagnosis present

## 2022-06-30 LAB — ACETAMINOPHEN LEVEL: Acetaminophen (Tylenol), Serum: 22 ug/mL (ref 10–30)

## 2022-06-30 LAB — COMPREHENSIVE METABOLIC PANEL
ALT: 25 U/L (ref 0–44)
AST: 34 U/L (ref 15–41)
Albumin: 4.3 g/dL (ref 3.5–5.0)
Alkaline Phosphatase: 50 U/L (ref 38–126)
Anion gap: 9 (ref 5–15)
BUN: 14 mg/dL (ref 6–20)
CO2: 25 mmol/L (ref 22–32)
Calcium: 9.2 mg/dL (ref 8.9–10.3)
Chloride: 105 mmol/L (ref 98–111)
Creatinine, Ser: 0.76 mg/dL (ref 0.44–1.00)
GFR, Estimated: >60 mL/min (ref >60)
Glucose, Bld: 123 mg/dL — ABNORMAL HIGH (ref 70–99)
Potassium: 3.5 mmol/L (ref 3.5–5.1)
Sodium: 139 mmol/L (ref 135–145)
Total Bilirubin: 0.6 mg/dL (ref 0.3–1.2)
Total Protein: 7.4 g/dL (ref 6.5–8.1)

## 2022-06-30 LAB — CBC
HCT: 42.1 % (ref 36.0–46.0)
Hemoglobin: 13.9 g/dL (ref 12.0–15.0)
MCH: 30.5 pg (ref 26.0–34.0)
MCHC: 33 g/dL (ref 30.0–36.0)
MCV: 92.3 fL (ref 80.0–100.0)
Platelets: 321 10*3/uL (ref 150–400)
RBC: 4.56 MIL/uL (ref 3.87–5.11)
RDW: 12.7 % (ref 11.5–15.5)
WBC: 12.5 10*3/uL — ABNORMAL HIGH (ref 4.0–10.5)
nRBC: 0 % (ref 0.0–0.2)

## 2022-06-30 LAB — SALICYLATE LEVEL: Salicylate Lvl: <7 mg/dL — ABNORMAL LOW (ref 7.0–30.0)

## 2022-06-30 LAB — ETHANOL: Alcohol, Ethyl (B): <10 mg/dL (ref <10)
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
## 2022-06-30 NOTE — ED Notes (Addendum)
Pt dressed out into hospital scrubs with this nurse and Wendelyn Breslow, EDT in room. Belongings placed into hospital bag and labeled with pt information.  Items include: 1 blue top 1 pair black boots 1 black tank top 1 bra 1 pair of underwear  1 pair striped socks 1 pair plaid pants 1 necklace  1 pair earrings

## 2022-06-30 NOTE — ED Notes (Signed)
Pt has 2 bags of belongings 

## 2022-07-01 DIAGNOSIS — F15959 Other stimulant use, unspecified with stimulant-induced psychotic disorder, unspecified: Secondary | ICD-10-CM | POA: Diagnosis present

## 2022-07-01 LAB — POC URINE PREG, ED: Preg Test, Ur: NEGATIVE

## 2022-07-01 LAB — URINE DRUG SCREEN, QUALITATIVE (ARMC ONLY)
Amphetamines, Ur Screen: POSITIVE — AB
Barbiturates, Ur Screen: NOT DETECTED
Benzodiazepine, Ur Scrn: NOT DETECTED
Cannabinoid 50 Ng, Ur ~~LOC~~: POSITIVE — AB
Cocaine Metabolite,Ur ~~LOC~~: NOT DETECTED
MDMA (Ecstasy)Ur Screen: NOT DETECTED
Methadone Scn, Ur: NOT DETECTED
Opiate, Ur Screen: NOT DETECTED
Phencyclidine (PCP) Ur S: NOT DETECTED
Tricyclic, Ur Screen: POSITIVE — AB

## 2022-07-01 NOTE — ED Notes (Signed)
Breakfast tray given to pt.

## 2022-07-01 NOTE — Consult Note (Signed)
[facility] Face-to-Face Psychiatry Consult   Reason for Consult:  meth abuse with paranoia per IVC Referring Physician:  EDP Patient Identification: Shelia Butler MRN:  [id number] Principal Diagnosis: Methamphetamine-induced psychotic disorder (HCC) Diagnosis:  Principal Problem:   Methamphetamine-induced psychotic disorder (HCC)   Total Time spent with patient: 45 minutes  Subjective:   Shelia Butler is a 37 y.o. female patient admitted with meth abuse and paranoia.  "I'm ready to go, I shouldn't be here."  HPI:  37 yo female who presented under IVC after using meth and experiencing paranoia by her family.  On assessment, when asked what brought her to the ED, she stated "My a*#hole brother".  She denies depression, suicidal and homicidal ideations.  No psychosis or paranoia during the assessment and client denies having any.  Anxiety on assessment at a moderate level.  She does not want rehab or recovery for substance issues, minimizes use.  Shelia Butler provided collateral information for her boyfriend, Shelia Butler, who was contacted by the psych team and stated he had no safety concerns about the patient and this was all because "her vindictive family".  Psych cleared and he is leaving to came and get her.    Past Psychiatric History: substance abuse, anxiety  Risk to Self:  none Risk to Others:  none Prior Inpatient Therapy:  none Prior Outpatient Therapy:  none  Past Medical History:  Past Medical History:  Diagnosis Date   Anxiety    Asthma     Past Surgical History:  Procedure Laterality Date   LEEP     Family History:  Family History  Problem Relation Age of Onset   Breast cancer Mother    Heart disease Mother    Heart attack Mother 28   Diabetes Father    Osteoarthritis Father    Alcohol abuse Father    Family Psychiatric  History: see above Social History:  Social History   Substance and Sexual Activity  Alcohol Use Yes   Comment: occassional     Social History   Substance  and Sexual Activity  Drug Use Yes   Frequency: 10.0 times per week   Types: Marijuana   Comment: last use 3 days ago    Social History   Socioeconomic History   Marital status: Married    Spouse name: Not on file   Number of children: Not on file   Years of education: Not on file   Highest education level: Not on file  Occupational History   Not on file  Tobacco Use   Smoking status: Every Day    Packs/day: 0.50    Years: 15.00    Total pack years: 7.50    Types: Cigarettes   Smokeless tobacco: Never  Vaping Use   Vaping Use: Never used  Substance and Sexual Activity   Alcohol use: Yes    Comment: occassional   Drug use: Yes    Frequency: 10.0 times per week    Types: Marijuana    Comment: last use 3 days ago   Sexual activity: Not on file  Other Topics Concern   Not on file  Social History Narrative   Not on file   Social Determinants of Health   Financial Resource Strain: Not on file  Food Insecurity: Not on file  Transportation Needs: Not on file  Physical Activity: Not on file  Stress: Not on file  Social Connections: Not on file   Additional Social History:    Allergies:  No Known Allergies  Labs:  Results for orders placed or performed during the hospital encounter of 06/30/22 (from the past 48 hour(s))  Comprehensive metabolic panel     Status: Abnormal   Collection Time: 06/30/22  9:07 PM  Result Value Ref Range   Sodium 139 135 - 145 mmol/L   Potassium 3.5 3.5 - 5.1 mmol/L   Chloride 105 98 - 111 mmol/L   CO2 25 22 - 32 mmol/L   Glucose, Bld 123 (H) 70 - 99 mg/dL    Comment: Glucose reference range applies only to samples taken after fasting for at least 8 hours.   BUN 14 6 - 20 mg/dL   Creatinine, Ser 0.76 0.44 - 1.00 mg/dL   Calcium 9.2 8.9 - 10.3 mg/dL   Total Protein 7.4 6.5 - 8.1 g/dL   Albumin 4.3 3.5 - 5.0 g/dL   AST 34 15 - 41 U/L   ALT 25 0 - 44 U/L   Alkaline Phosphatase 50 38 - 126 U/L   Total Bilirubin 0.6 0.3 - 1.2 mg/dL    GFR, Estimated >60 >60 mL/min    Comment: (NOTE) Calculated using the CKD-EPI Creatinine Equation (2021)    Anion gap 9 5 - 15    Comment: Performed at [facility], [address] [place], [place] [address]  cbc     Status: Abnormal   Collection Time: 06/30/22  9:07 PM  Result Value Ref Range   WBC 12.5 (H) 4.0 - 10.5 K/uL   RBC 4.56 3.87 - 5.11 MIL/uL   Hemoglobin 13.9 12.0 - 15.0 g/dL   HCT 42.1 36.0 - 46.0 %   MCV 92.3 80.0 - 100.0 fL   MCH 30.5 26.0 - 34.0 pg   MCHC 33.0 30.0 - 36.0 g/dL   RDW 12.7 11.5 - 15.5 %   Platelets 321 150 - 400 K/uL   nRBC 0.0 0.0 - 0.2 %    Comment: Performed at [facility], [address] [place], [place] [address]  Ethanol     Status: None   Collection Time: 06/30/22  9:38 PM  Result Value Ref Range   Alcohol, Ethyl (B) <10 <10 mg/dL    Comment: (NOTE) Lowest detectable limit for serum alcohol is 10 mg/dL.  For medical purposes only. Performed at [facility], [address] [place], [place] [address]   Salicylate level     Status: Abnormal   Collection Time: 06/30/22  9:38 PM  Result Value Ref Range   Salicylate Lvl <9.7 (L) 7.0 - 30.0 mg/dL    Comment: Performed at [facility], [address] [place], [place] [address]  Acetaminophen level     Status: None   Collection Time: 06/30/22  9:38 PM  Result Value Ref Range   Acetaminophen (Tylenol), Serum 22 10 - 30 ug/mL    Comment: (NOTE) Therapeutic concentrations vary significantly. A range of 10-30 ug/mL  may be an effective concentration for many patients. However, some  are best treated at concentrations outside of this range. Acetaminophen concentrations >150 ug/mL at 4 hours after ingestion  and >50 ug/mL at 12 hours after ingestion are often associated with  toxic reactions.  Performed at [facility], [address] [place], [place] [address]   Urine Drug Screen, Qualitative     Status: Abnormal   Collection Time:  07/01/22  8:45 AM  Result Value Ref Range   Tricyclic, Ur Screen POSITIVE (A) NONE DETECTED   Amphetamines, Ur Screen POSITIVE (A) NONE DETECTED  MDMA (Ecstasy)Ur Screen NONE DETECTED NONE DETECTED   Cocaine Metabolite,Ur [place] NONE DETECTED NONE DETECTED   Opiate, Ur Screen NONE DETECTED NONE DETECTED   Phencyclidine (PCP) Ur S NONE DETECTED NONE DETECTED   Cannabinoid 50 Ng, Ur [place] POSITIVE (A) NONE DETECTED   Barbiturates, Ur Screen NONE DETECTED NONE DETECTED   Benzodiazepine, Ur Scrn NONE DETECTED NONE DETECTED   Methadone Scn, Ur NONE DETECTED NONE DETECTED    Comment: (NOTE) Tricyclics + metabolites, urine    Cutoff 1000 ng/mL Amphetamines + metabolites, urine  Cutoff 1000 ng/mL MDMA (Ecstasy), urine              Cutoff 500 ng/mL Cocaine Metabolite, urine          Cutoff 300 ng/mL Opiate + metabolites, urine        Cutoff 300 ng/mL Phencyclidine (PCP), urine         Cutoff 25 ng/mL Cannabinoid, urine                 Cutoff 50 ng/mL Barbiturates + metabolites, urine  Cutoff 200 ng/mL Benzodiazepine, urine              Cutoff 200 ng/mL Methadone, urine                   Cutoff 300 ng/mL  The urine drug screen provides only a preliminary, unconfirmed analytical test result and should not be used for non-medical purposes. Clinical consideration and professional judgment should be applied to any positive drug screen result due to possible interfering substances. A more specific alternate chemical method must be used in order to obtain a confirmed analytical result. Gas chromatography / mass spectrometry (GC/MS) is the preferred confirm atory method. Performed at [facility], [address] [place], [place] [address]   POC urine preg, ED     Status: None   Collection Time: 07/01/22  8:47 AM  Result Value Ref Range   Preg Test, Ur NEGATIVE NEGATIVE    Comment:        THE SENSITIVITY OF THIS METHODOLOGY IS >24 mIU/mL     No current facility-administered  medications for this encounter.   No current outpatient medications on file.    Musculoskeletal: Strength & Muscle Tone: within normal limits Gait & Station: normal Patient leans: N/A  Psychiatric Specialty Exam: Physical Exam Vitals and nursing note reviewed.  Constitutional:      Appearance: Normal appearance.  HENT:     Head: Normocephalic.     Nose: Nose normal.  Pulmonary:     Effort: Pulmonary effort is normal.  Musculoskeletal:        General: Normal range of motion.     Cervical back: Normal range of motion.  Neurological:     General: No focal deficit present.     Mental Status: She is alert and oriented to person, place, and time.  Psychiatric:        Attention and Perception: Attention and perception normal.        Mood and Affect: Mood is anxious.        Speech: Speech normal.        Behavior: Behavior normal. Behavior is cooperative.        Thought Content: Thought content normal.        Cognition and Memory: Cognition and memory normal.        Judgment: Judgment normal.     Review of Systems  Psychiatric/Behavioral:  Positive for substance abuse. The patient  is nervous/anxious.   All other systems reviewed and are negative.   Blood pressure 105/61, pulse 71, temperature 98.7 F (37.1 C), temperature source Oral, resp. rate 16, height 5\' 5"  (1.651 m), weight 59 kg, SpO2 98 %.Body mass index is 21.63 kg/m.  General Appearance: Disheveled  Eye Contact:  Good  Speech:  Normal Rate  Volume:  Normal  Mood:  Anxious  Affect:  Congruent  Thought Process:  Coherent  Orientation:  Full (Time, Place, and Person)  Thought Content:  WDL and Logical  Suicidal Thoughts:  No  Homicidal Thoughts:  No  Memory:  Immediate;   Good Recent;   Good Remote;   Good  Judgement:  Fair  Insight:  Fair  Psychomotor Activity:  Normal  Concentration:  Concentration: Good and Attention Span: Good  Recall:  Good  Fund of Knowledge:  Good  Language:  Good  Akathisia:  No   Handed:  Right  AIMS (if indicated):     Assets:  Housing Intimacy Leisure Time Physical Health Resilience Social Support  ADL's:  Intact  Cognition:  WNL  Sleep:        Physical Exam: Physical Exam Vitals and nursing note reviewed.  Constitutional:      Appearance: Normal appearance.  HENT:     Head: Normocephalic.     Nose: Nose normal.  Pulmonary:     Effort: Pulmonary effort is normal.  Musculoskeletal:        General: Normal range of motion.     Cervical back: Normal range of motion.  Neurological:     General: No focal deficit present.     Mental Status: She is alert and oriented to person, place, and time.  Psychiatric:        Attention and Perception: Attention and perception normal.        Mood and Affect: Mood is anxious.        Speech: Speech normal.        Behavior: Behavior normal. Behavior is cooperative.        Thought Content: Thought content normal.        Cognition and Memory: Cognition and memory normal.        Judgment: Judgment normal.    Review of Systems  Psychiatric/Behavioral:  Positive for substance abuse. The patient is nervous/anxious.   All other systems reviewed and are negative.  Blood pressure 105/61, pulse 71, temperature 98.7 F (37.1 C), temperature source Oral, resp. rate 16, height 5\' 5"  (1.651 m), weight 59 kg, SpO2 98 %. Body mass index is 21.63 kg/m.  Treatment Plan Summary: Methamphetamine abuse: Recommended [facility] outpatient substance abuse IOP, patient declined  Disposition: No evidence of imminent risk to self or others at present.   Patient does not meet criteria for psychiatric inpatient admission. Supportive therapy provided about ongoing stressors.  Waylan Boga, NP 07/01/2022 10:20 AM

## 2022-07-01 NOTE — ED Provider Notes (Addendum)
Emergency Medicine Observation Re-evaluation Note  Shelia Butler is a 37 y.o. female, seen on rounds today.  Pt initially presented to the ED for complaints of IVC  Currently, the patient is is no acute distress. Denies any concerns at this time.  Says that she will talk with psychiatry this morning.  States that she is not crazy and that if she was planning on killing herself she would have done it already.  Physical Exam  Blood pressure 111/60, pulse 70, temperature (!) 97.5 F (36.4 C), temperature source Oral, resp. rate 18, height 5\' 5"  (1.651 m), weight 59 kg, SpO2 100 %.  Physical Exam: General: No apparent distress Pulm: Normal WOB Neuro: Moving all extremities Psych: Resting comfortably     ED Course / MDM   Clinical Course as of 07/01/22 0718  Tue Jun 30, 2022  2329 The patient has been placed in psychiatric observation due to the need to provide a safe environment for the patient while obtaining psychiatric consultation and evaluation, as well as ongoing medical and medication management to treat the patient's condition.  The patient has been placed under full IVC at this time.   [DS]    Clinical Course User Index [DS] Vladimir Crofts, MD    I have reviewed the labs performed to date as well as medications administered while in observation.  Recent changes in the last 24 hours include: No acute events overnight.  Plan   Current plan: Patient awaiting psychiatric disposition. Patient is under full IVC at this time.   After evaluation by psychiatry and getting further information from her brother patient was cleared.  IVC was rescinded and patient had recommendations from psychiatry to be discharged home with outpatient follow-up.   Nathaniel Man, MD 07/01/22 [id number]    Nathaniel Man, MD 07/01/22 5993    Nathaniel Man, MD 07/01/22 1023

## 2022-07-01 NOTE — ED Notes (Addendum)
TTS and NP [name] in pts room for assessment. Per NP, pt not willing to wake and will need assessment in AM.

## 2022-07-01 NOTE — BH Assessment (Signed)
This Probation officer attempted to assess pt, alongside psych NP [name] T. Pt refused to participate in the assessment. Psych team to assess when pt awakens.

## 2022-07-01 NOTE — ED Notes (Signed)
Verified correct patient and correct discharge papers given. Pt alert and oriented X 4, stable for discharge. RR even and unlabored, color WNL. Discussed discharge instructions and follow-up as directed. Discharge medications discussed, when prescribed. Pt had opportunity to ask questions, and RN available to provide patient and/or family education. Returned all belongings. Per request of patient, Non emergency 911 number contacted to ask for patient ride home as patient was brought in under IVC. Pt will be waiting in waiting room. Pt declines repeat VS.

## 2022-12-01 ENCOUNTER — Telehealth: Payer: Self-pay | Admitting: Licensed Clinical Social Worker

## 2022-12-01 NOTE — Telephone Encounter (Signed)
Patient lvm requesting mental health services. LCSW returned call and spoke with patient. Patient reported she is looking for mental health services because she is hearing voices - she denies any command voices telling her to harm self or others, she reports having paranoid thoughts that her ex was injecting her and is tracking her and is hearing voices about this. LCSW provided information about [facility] and provided crisis number. Patient voiced appreciation.

## 2024-06-27 ENCOUNTER — Ambulatory Visit: Payer: Self-pay
